# Patient Record
Sex: Female | Born: 1970 | Race: White | Hispanic: No | Marital: Single | Smoking: Never smoker
Health system: Southern US, Community
[De-identification: ages and names within clinical notes are randomized; demographics above are authoritative.]

## PROBLEM LIST (undated history)

## (undated) DIAGNOSIS — I639 Cerebral infarction, unspecified: Secondary | ICD-10-CM

## (undated) DIAGNOSIS — G459 Transient cerebral ischemic attack, unspecified: Secondary | ICD-10-CM

## (undated) DIAGNOSIS — E785 Hyperlipidemia, unspecified: Secondary | ICD-10-CM

## (undated) HISTORY — DX: Hyperlipidemia, unspecified: E78.5

## (undated) HISTORY — DX: Cerebral infarction, unspecified: I63.9

## (undated) HISTORY — DX: Transient cerebral ischemic attack, unspecified: G45.9

---

## 2014-02-23 DIAGNOSIS — I639 Cerebral infarction, unspecified: Secondary | ICD-10-CM

## 2014-02-23 DIAGNOSIS — Q211 Atrial septal defect, unspecified: Secondary | ICD-10-CM

## 2014-02-23 HISTORY — DX: Atrial septal defect, unspecified: Q21.10

## 2014-02-23 HISTORY — DX: Cerebral infarction, unspecified: I63.9

## 2015-01-22 ENCOUNTER — Observation Stay (HOSPITAL_COMMUNITY): Payer: BLUE CROSS/BLUE SHIELD

## 2015-01-22 ENCOUNTER — Encounter (HOSPITAL_COMMUNITY): Payer: Self-pay | Admitting: Emergency Medicine

## 2015-01-22 ENCOUNTER — Emergency Department (HOSPITAL_COMMUNITY): Payer: BLUE CROSS/BLUE SHIELD

## 2015-01-22 ENCOUNTER — Inpatient Hospital Stay (HOSPITAL_COMMUNITY)
Admission: EM | Admit: 2015-01-22 | Discharge: 2015-01-25 | DRG: 065 | Disposition: A | Discharge status: Home / Self Care | Payer: BLUE CROSS/BLUE SHIELD | Attending: Internal Medicine | Admitting: Internal Medicine

## 2015-01-22 DIAGNOSIS — I6340 Cerebral infarction due to embolism of unspecified cerebral artery: Secondary | ICD-10-CM | POA: Diagnosis not present

## 2015-01-22 DIAGNOSIS — D649 Anemia, unspecified: Secondary | ICD-10-CM | POA: Diagnosis present

## 2015-01-22 DIAGNOSIS — I253 Aneurysm of heart: Secondary | ICD-10-CM | POA: Diagnosis present

## 2015-01-22 DIAGNOSIS — E785 Hyperlipidemia, unspecified: Secondary | ICD-10-CM

## 2015-01-22 DIAGNOSIS — I10 Essential (primary) hypertension: Secondary | ICD-10-CM | POA: Diagnosis present

## 2015-01-22 DIAGNOSIS — G458 Other transient cerebral ischemic attacks and related syndromes: Secondary | ICD-10-CM

## 2015-01-22 DIAGNOSIS — Q211 Atrial septal defect, unspecified: Secondary | ICD-10-CM

## 2015-01-22 DIAGNOSIS — G459 Transient cerebral ischemic attack, unspecified: Secondary | ICD-10-CM

## 2015-01-22 DIAGNOSIS — I6789 Other cerebrovascular disease: Secondary | ICD-10-CM

## 2015-01-22 DIAGNOSIS — I639 Cerebral infarction, unspecified: Secondary | ICD-10-CM | POA: Diagnosis present

## 2015-01-22 HISTORY — DX: Transient cerebral ischemic attack, unspecified: G45.9

## 2015-01-22 HISTORY — DX: Hyperlipidemia, unspecified: E78.5

## 2015-01-22 LAB — I-STAT CHEM 8, ED
BUN: 13 mg/dL (ref 6–20)
CALCIUM ION: 1.05 mmol/L — AB (ref 1.12–1.23)
CHLORIDE: 104 mmol/L (ref 101–111)
CREATININE: 0.8 mg/dL (ref 0.44–1.00)
Glucose, Bld: 93 mg/dL (ref 65–99)
HCT: 40 % (ref 36.0–46.0)
Hemoglobin: 13.6 g/dL (ref 12.0–15.0)
Potassium: 3.6 mmol/L (ref 3.5–5.1)
Sodium: 136 mmol/L (ref 135–145)
TCO2: 19 mmol/L (ref 0–100)

## 2015-01-22 LAB — COMPREHENSIVE METABOLIC PANEL
ALBUMIN: 4 g/dL (ref 3.5–5.0)
ALK PHOS: 77 U/L (ref 38–126)
ALT: 18 U/L (ref 14–54)
ANION GAP: 9 (ref 5–15)
AST: 22 U/L (ref 15–41)
BILIRUBIN TOTAL: 0.8 mg/dL (ref 0.3–1.2)
BUN: 11 mg/dL (ref 6–20)
CALCIUM: 9 mg/dL (ref 8.9–10.3)
CO2: 21 mmol/L — AB (ref 22–32)
CREATININE: 0.93 mg/dL (ref 0.44–1.00)
Chloride: 106 mmol/L (ref 101–111)
GFR calc Af Amer: >60 mL/min (ref >60)
GFR calc non Af Amer: >60 mL/min (ref >60)
GLUCOSE: 100 mg/dL — AB (ref 65–99)
Potassium: 3.6 mmol/L (ref 3.5–5.1)
SODIUM: 136 mmol/L (ref 135–145)
TOTAL PROTEIN: 7.1 g/dL (ref 6.5–8.1)

## 2015-01-22 LAB — DIFFERENTIAL
Basophils Absolute: 0 10*3/uL (ref 0.0–0.1)
Basophils Relative: 0 %
EOS PCT: 1 %
Eosinophils Absolute: 0.1 10*3/uL (ref 0.0–0.7)
LYMPHS ABS: 2 10*3/uL (ref 0.7–4.0)
LYMPHS PCT: 31 %
Monocytes Absolute: 0.4 10*3/uL (ref 0.1–1.0)
Monocytes Relative: 6 %
NEUTROS ABS: 3.9 10*3/uL (ref 1.7–7.7)
NEUTROS PCT: 62 %

## 2015-01-22 LAB — PROTIME-INR
INR: 1.07 (ref 0.00–1.49)
Prothrombin Time: 14.1 seconds (ref 11.6–15.2)

## 2015-01-22 LAB — APTT: aPTT: 28 seconds (ref 24–37)

## 2015-01-22 LAB — CBC
HCT: 38.5 % (ref 36.0–46.0)
HEMOGLOBIN: 12.7 g/dL (ref 12.0–15.0)
MCH: 29.5 pg (ref 26.0–34.0)
MCHC: 33 g/dL (ref 30.0–36.0)
MCV: 89.3 fL (ref 78.0–100.0)
PLATELETS: 192 10*3/uL (ref 150–400)
RBC: 4.31 MIL/uL (ref 3.87–5.11)
RDW: 13.4 % (ref 11.5–15.5)
WBC: 6.3 10*3/uL (ref 4.0–10.5)

## 2015-01-22 LAB — ETHANOL: Alcohol, Ethyl (B): <5 mg/dL (ref <5)

## 2015-01-22 LAB — I-STAT TROPONIN, ED: Troponin i, poc: 0 ng/mL (ref 0.00–0.08)

## 2015-01-22 LAB — CBG MONITORING, ED: Glucose-Capillary: 93 mg/dL (ref 65–99)

## 2015-01-22 MED ORDER — ASPIRIN 325 MG PO TABS
325.0000 mg | ORAL_TABLET | Freq: Every day | ORAL | Status: DC
  Administered 2015-01-22 – 2015-01-25 (×4): 325 mg via ORAL
  Filled 2015-01-22 (×4): qty 1

## 2015-01-22 MED ORDER — SENNOSIDES-DOCUSATE SODIUM 8.6-50 MG PO TABS: 1.0000 | ORAL_TABLET | Freq: Every evening | ORAL | Status: DC | PRN

## 2015-01-22 MED ORDER — IOHEXOL 350 MG/ML SOLN
50.0000 mL | Freq: Once | INTRAVENOUS | Status: AC | PRN
  Administered 2015-01-22: 50 mL via INTRAVENOUS

## 2015-01-22 MED ORDER — ASPIRIN 300 MG RE SUPP: 300.0000 mg | Freq: Every day | RECTAL | Status: DC

## 2015-01-22 MED ORDER — STROKE: EARLY STAGES OF RECOVERY BOOK
Freq: Once | Status: AC
Start: 2015-01-22 — End: 2015-01-22
  Administered 2015-01-22: 16:00:00

## 2015-01-22 MED ORDER — SODIUM CHLORIDE 0.9 % IV SOLN
INTRAVENOUS | Status: DC
  Administered 2015-01-22: 19:00:00 via INTRAVENOUS

## 2015-01-22 MED ORDER — ENOXAPARIN SODIUM 30 MG/0.3ML ~~LOC~~ SOLN
30.0000 mg | SUBCUTANEOUS | Status: DC
  Administered 2015-01-22: 30 mg via SUBCUTANEOUS
  Filled 2015-01-22: qty 0.3

## 2015-01-22 NOTE — ED Provider Notes (Signed)
CSN: [id number]     Arrival date & time 01/22/15  1129 History   First MD Initiated Contact with Patient 01/22/15 1153     Chief Complaint  Patient presents with  . Dizziness  . Blurred Vision    @EDPCLEARED @ (Consider location/radiation/quality/duration/timing/severity/associated sxs/prior Treatment) HPI  She is a 44 year old female with no history of hypertension or hyperlipidemia presenting with blurred vision in her right eye and right leg weakness. This happened 30 minutes prior to arrival. On arrival this was mildly resolving. Code stroke was called. Patient went to CAT scan . Neurology saw patient. Patient's symptoms are now completely resolved. She has no headache. She is no history of this. She's both [place] and [place] speaking.   History reviewed. No pertinent past medical history. History reviewed. No pertinent past surgical history. No family history on file. Social History  Substance Use Topics  . Smoking status: None  . Smokeless tobacco: None  . Alcohol Use: None   OB History    No data available     Review of Systems  Constitutional: Negative for activity change and fatigue.  HENT: Negative for congestion.   Eyes: Negative for discharge.  Respiratory: Negative for cough and chest tightness.   Cardiovascular: Negative for chest pain.  Gastrointestinal: Negative for abdominal distention.  Genitourinary: Negative for dysuria and difficulty urinating.  Musculoskeletal: Negative for joint swelling.  Skin: Negative for rash.  Allergic/Immunologic: Negative for immunocompromised state.  Neurological: Positive for weakness. Negative for seizures and speech difficulty.  Psychiatric/Behavioral: Negative for agitation.      Allergies  Review of patient's allergies indicates not on file.  Home Medications   Prior to Admission medications   Not on File   BP 120/102 mmHg  Pulse 85  Resp 24  SpO2 97%  LMP 01/22/2015 (Exact Date) Physical Exam   Constitutional: She is oriented to person, place, and time. She appears well-developed and well-nourished.  HENT:  Head: Normocephalic and atraumatic.  Eyes: Conjunctivae are normal. Right eye exhibits no discharge.  Neck: Neck supple.  Cardiovascular: Normal rate, regular rhythm and normal heart sounds.   No murmur heard. Pulmonary/Chest: Effort normal and breath sounds normal. She has no wheezes. She has no rales.  Abdominal: Soft. She exhibits no distension. There is no tenderness.  Musculoskeletal: Normal range of motion. She exhibits no edema.  Neurological: She is oriented to person, place, and time. No cranial nerve deficit.  Skin: Skin is warm and dry. No rash noted. She is not diaphoretic.  Psychiatric: She has a normal mood and affect. Her behavior is normal.  Nursing note and vitals reviewed.   ED Course  Procedures (including critical care time) Labs Review Labs Reviewed  COMPREHENSIVE METABOLIC PANEL - Abnormal; Notable for the following:    CO2 21 (*)    Glucose, Bld 100 (*)    All other components within normal limits  I-STAT CHEM 8, ED - Abnormal; Notable for the following:    Calcium, Ion 1.05 (*)    All other components within normal limits  ETHANOL  PROTIME-INR  APTT  CBC  DIFFERENTIAL  URINE RAPID DRUG SCREEN, HOSP PERFORMED  URINALYSIS, ROUTINE W REFLEX MICROSCOPIC (NOT AT [facility])  I-STAT TROPOININ, ED  CBG MONITORING, ED    Imaging Review Ct Head Wo Contrast  01/22/2015  CLINICAL DATA:  Code stroke.  Blurred vision.  Right-sided weakness. EXAM: CT HEAD WITHOUT CONTRAST TECHNIQUE: Contiguous axial images were obtained from the base of the skull through the vertex without intravenous  contrast. COMPARISON:  None. FINDINGS: Ventricle size is normal. Negative for acute or chronic infarction. Negative for hemorrhage or fluid collection. Negative for mass or edema. No shift of the midline structures. Calvarium is intact. Right upper third molar periapical  lucency compatible with dental infection. IMPRESSION: Normal CT of the brain. Critical Value/emergent results were called by telephone at the time of interpretation on 01/22/2015 at 12:03 pm to Dr. Silverio Decamp , who verbally acknowledged these results. Electronically Signed   By: Franchot Gallo M.D.   On: 01/22/2015 12:04   I have personally reviewed and evaluated these images and lab results as part of my medical decision-making.   EKG Interpretation   Date/Time:  Tuesday January 22 2015 12:06:06 EST Ventricular Rate:  87 PR Interval:  170 QRS Duration: 106 QT Interval:  398 QTC Calculation: 479 R Axis:   56 Text Interpretation:  Sinus rhythm Ventricular premature complex RSR' in  V1 or V2, right VCD or RVH no acute ischemia. Confirmed by Gerald Leitz ([id number]) on 01/22/2015 12:59:09 PM      MDM   Final diagnoses:  TIA (transient ischemic attack)  TIA (transient ischemic attack)   patient is a pleasant 44 year old female with no significant past medical history presenting with strokelike symptoms of right eye blurriness and right leg weakness. Code stroke was called. Patient's symptoms now resolved. Patient is to be admitted to watch inside the TIA window.. Will admit to medicine. Patient has no somatic complaints at this time.  Kenly Xiao Julio Alm, MD 01/22/15 1259

## 2015-01-22 NOTE — ED Notes (Signed)
CODE STROKE ACTIVATED @ 11:35

## 2015-01-22 NOTE — H&P (Signed)
[facility] History and Physical  Cicley Main [id number] DOB: March 11, 1970 DOA: 01/22/2015  Referring physician: Thomasene Lot  PCP: No primary care provider on file.   Chief Complaint: Blurry vision right eye.   HPI: Tiffany Gardner is a 44 y.o. female with PMH significant for HTN, HLD who presents with  Right eye blurry vision, and right leg weakness, patient symptoms resolved, didn't required TPA. Patient relates feeling better, weakness has resolved, numbness resolved. She relates mild headaches. Denies chest pain, or dyspnea. She doesn't have PM problems.   Evaluation in the ED;   Labs unremarkable, CT head normal.   Review of Systems:  Negative, except as per HPI   PMH; none per patient.   History reviewed. No pertinent past surgical history. Social History:  has no tobacco, alcohol, and drug history on file.  No Known Allergies  Family History: Father: throat cancer,   Prior to Admission medications   Not on File  Does not take any medications.   Physical Exam: Filed Vitals:   01/22/15 1415 01/22/15 1442 01/22/15 1445 01/22/15 1600  BP: 125/71 138/61  105/52  Pulse: 84 77  82  Temp:  98.7 F (37.1 C)  98.8 F (37.1 C)  TempSrc:  Oral  Oral  Resp: 16 18  20   Height:   5\' 9"  (1.753 m)   Weight:   87.998 kg (194 lb)   SpO2: 94% 98%  97%    Wt Readings from Last 3 Encounters:  01/22/15 87.998 kg (194 lb)    General:  Appears calm and comfortable Eyes: PERRL, normal lids, irises & conjunctiva ENT: grossly normal hearing, lips & tongue Neck: no LAD, masses or thyromegaly Cardiovascular: RRR, no m/r/g. No LE edema. Telemetry: SR, no arrhythmias  Respiratory: CTA bilaterally, no w/r/r. Normal respiratory effort. Abdomen: soft, ntnd Skin: no rash or induration seen on limited exam Musculoskeletal: grossly normal tone BUE/BLE Psychiatric: grossly normal mood and affect, speech fluent and appropriate Neurologic: grossly non-focal.          Labs on Admission:    Basic Metabolic Panel:  Recent Labs Lab 01/22/15 1155 01/22/15 1203  NA 136 136  K 3.6 3.6  CL 106 104  CO2 21*  --   GLUCOSE 100* 93  BUN 11 13  CREATININE 0.93 0.80  CALCIUM 9.0  --    Liver Function Tests:  Recent Labs Lab 01/22/15 1155  AST 22  ALT 18  ALKPHOS 77  BILITOT 0.8  PROT 7.1  ALBUMIN 4.0   No results for input(s): LIPASE, AMYLASE in the last 168 hours. No results for input(s): AMMONIA in the last 168 hours. CBC:  Recent Labs Lab 01/22/15 1155 01/22/15 1203  WBC 6.3  --   NEUTROABS 3.9  --   HGB 12.7 13.6  HCT 38.5 40.0  MCV 89.3  --   PLT 192  --    Cardiac Enzymes: No results for input(s): CKTOTAL, CKMB, CKMBINDEX, TROPONINI in the last 168 hours.  BNP (last 3 results) No results for input(s): BNP in the last 8760 hours.  ProBNP (last 3 results) No results for input(s): PROBNP in the last 8760 hours.  CBG:  Recent Labs Lab 01/22/15 1208  GLUCAP 93    Radiological Exams on Admission: Ct Angio Head W/cm &/or Wo Cm  01/22/2015  CLINICAL DATA:  Right-sided weakness and dizziness. Code stroke earlier today. Symptoms have now resolved. EXAM: CT ANGIOGRAPHY HEAD AND NECK TECHNIQUE: Multidetector CT imaging of the head and neck was performed  using the standard protocol during bolus administration of intravenous contrast. Multiplanar CT image reconstructions and MIPs were obtained to evaluate the vascular anatomy. Carotid stenosis measurements (when applicable) are obtained utilizing NASCET criteria, using the distal internal carotid diameter as the denominator. CONTRAST:  38mL OMNIPAQUE IOHEXOL 350 MG/ML SOLN COMPARISON:  Noncontrast head CT earlier today FINDINGS: CT HEAD Brain: There is no evidence of acute infarct, intracranial hemorrhage, mass, midline shift, or extra-axial fluid collection on this contrast-enhanced CT. Ventricles and sulci are normal. Calvarium and skull base: No fracture or destructive osseous lesion. Paranasal sinuses:  Clear. Minimal right maxillary sinus mucosal thickening and osteitis is suggestive of chronic sinusitis. Right maxillary molar periapical lucency as described on CT earlier today. Orbits: Unremarkable. CTA NECK Aortic arch: 3 vessel aortic arch. Brachiocephalic and subclavian arteries are widely patent. Right carotid system: Patent without evidence of stenosis, dissection, or aneurysm. No significant atherosclerosis. Superior thyroid artery incidentally arises at the carotid bifurcation. Left carotid system: Patent without evidence of stenosis, dissection, or aneurysm. No significant atherosclerotic disease. Vertebral arteries: Widely patent and codominant. Skeleton: Mild lower cervical disc degeneration, greatest at C6-7. Other neck: Heterogeneous, asymmetric enlargement of the right thyroid lobe with discrete nodules measuring up to 11 mm in size. CTA HEAD Anterior circulation: Internal carotid arteries are patent from skullbase to carotid termini without stenosis. ACAs and MCAs are patent without evidence of sizable branch vessel occlusion or significant stenosis. No intracranial aneurysm is identified. Posterior circulation: Intracranial vertebral arteries are widely patent to the basilar. PICA and SCA origins are patent. Basilar artery is patent without stenosis. Posterior communicating arteries are not clearly identified. PCAs are patent without evidence of significant stenosis. Venous sinuses: Patent. Anatomic variants: None. Delayed phase: No abnormal enhancement. IMPRESSION: 1. No evidence of medium or large vessel intracranial arterial occlusion or significant stenosis. 2. Widely patent cervical carotid and vertebral arteries. Electronically Signed   By: Logan Bores M.D.   On: 01/22/2015 14:20   Dg Chest 2 View  01/22/2015  CLINICAL DATA:  Chest pain. EXAM: CHEST  2 VIEW COMPARISON:  None. FINDINGS: The heart size and mediastinal contours are within normal limits. Both lungs are clear. No pneumothorax  or pleural effusion is noted. The visualized skeletal structures are unremarkable. IMPRESSION: No active cardiopulmonary disease. Electronically Signed   By: Marijo Conception, M.D.   On: 01/22/2015 15:56   Ct Head Wo Contrast  01/22/2015  CLINICAL DATA:  Code stroke.  Blurred vision.  Right-sided weakness. EXAM: CT HEAD WITHOUT CONTRAST TECHNIQUE: Contiguous axial images were obtained from the base of the skull through the vertex without intravenous contrast. COMPARISON:  None. FINDINGS: Ventricle size is normal. Negative for acute or chronic infarction. Negative for hemorrhage or fluid collection. Negative for mass or edema. No shift of the midline structures. Calvarium is intact. Right upper third molar periapical lucency compatible with dental infection. IMPRESSION: Normal CT of the brain. Critical Value/emergent results were called by telephone at the time of interpretation on 01/22/2015 at 12:03 pm to Dr. Silverio Decamp , who verbally acknowledged these results. Electronically Signed   By: Franchot Gallo M.D.   On: 01/22/2015 12:04   Ct Angio Neck W/cm &/or Wo/cm  01/22/2015  CLINICAL DATA:  Right-sided weakness and dizziness. Code stroke earlier today. Symptoms have now resolved. EXAM: CT ANGIOGRAPHY HEAD AND NECK TECHNIQUE: Multidetector CT imaging of the head and neck was performed using the standard protocol during bolus administration of intravenous contrast. Multiplanar CT image reconstructions and MIPs were  obtained to evaluate the vascular anatomy. Carotid stenosis measurements (when applicable) are obtained utilizing NASCET criteria, using the distal internal carotid diameter as the denominator. CONTRAST:  51mL OMNIPAQUE IOHEXOL 350 MG/ML SOLN COMPARISON:  Noncontrast head CT earlier today FINDINGS: CT HEAD Brain: There is no evidence of acute infarct, intracranial hemorrhage, mass, midline shift, or extra-axial fluid collection on this contrast-enhanced CT. Ventricles and sulci are normal. Calvarium  and skull base: No fracture or destructive osseous lesion. Paranasal sinuses: Clear. Minimal right maxillary sinus mucosal thickening and osteitis is suggestive of chronic sinusitis. Right maxillary molar periapical lucency as described on CT earlier today. Orbits: Unremarkable. CTA NECK Aortic arch: 3 vessel aortic arch. Brachiocephalic and subclavian arteries are widely patent. Right carotid system: Patent without evidence of stenosis, dissection, or aneurysm. No significant atherosclerosis. Superior thyroid artery incidentally arises at the carotid bifurcation. Left carotid system: Patent without evidence of stenosis, dissection, or aneurysm. No significant atherosclerotic disease. Vertebral arteries: Widely patent and codominant. Skeleton: Mild lower cervical disc degeneration, greatest at C6-7. Other neck: Heterogeneous, asymmetric enlargement of the right thyroid lobe with discrete nodules measuring up to 11 mm in size. CTA HEAD Anterior circulation: Internal carotid arteries are patent from skullbase to carotid termini without stenosis. ACAs and MCAs are patent without evidence of sizable branch vessel occlusion or significant stenosis. No intracranial aneurysm is identified. Posterior circulation: Intracranial vertebral arteries are widely patent to the basilar. PICA and SCA origins are patent. Basilar artery is patent without stenosis. Posterior communicating arteries are not clearly identified. PCAs are patent without evidence of significant stenosis. Venous sinuses: Patent. Anatomic variants: None. Delayed phase: No abnormal enhancement. IMPRESSION: 1. No evidence of medium or large vessel intracranial arterial occlusion or significant stenosis. 2. Widely patent cervical carotid and vertebral arteries. Electronically Signed   By: Logan Bores M.D.   On: 01/22/2015 14:20   Mr Brain Wo Contrast  01/22/2015  ADDENDUM REPORT: 01/22/2015 17:48 ADDENDUM: Study discussed by telephone with Dr. Niel Hummer  on 01/22/2015 at 1735 hours. Electronically Signed   By: Genevie Ann M.D.   On: 01/22/2015 17:48  01/22/2015  CLINICAL DATA:  44 year old female with acute onset right visual field changes at 1050 hours today, associated with dizziness. Initial encounter. EXAM: MRI HEAD WITHOUT CONTRAST MRA HEAD WITHOUT CONTRAST TECHNIQUE: Multiplanar, multiecho pulse sequences of the brain and surrounding structures were obtained without intravenous contrast. Angiographic images of the head were obtained using MRA technique without contrast. COMPARISON:  CTA head and neck 1342 hours today. FINDINGS: MRI HEAD FINDINGS There are 2 small foci of restricted diffusion in the left posterior circulation. The larger is in the inferior left occipital lobe seen on series 3, image 31. There is then a subtle linear area of restricted diffusion in the left superior cerebellum (image 34). Both of these are visible on coronal diffusion imaging also. There is no associated parenchymal T2 or FLAIR hyperintensity. No associated hemorrhage or mass effect. There is subtle increased FLAIR signal in the left PCA branch visible on series 8, image 11. Major intracranial vascular flow voids are within normal limits. No other restricted diffusion. Pear[place] Cables and white matter signal elsewhere is within normal limits. No chronic cerebral blood products. No midline shift, mass effect, evidence of mass lesion, ventriculomegaly, extra-axial collection or acute intracranial hemorrhage. Cervicomedullary junction and pituitary are within normal limits. Negative visualized cervical spine. Normal bone marrow signal. Visible internal auditory structures appear normal. Mastoids are clear. Mild paranasal sinus mucosal thickening. Optic chiasm and bilateral  intraorbital soft tissues are normal. Negative scalp soft tissues. MRA HEAD FINDINGS Antegrade flow in the posterior circulation with codominant distal vertebral arteries. No distal vertebral artery stenosis. Bilateral PICA  flow is present. Normal vertebrobasilar junction. No basilar stenosis. SCA and PCA origins are normal. Posterior communicating arteries are diminutive or absent. Bilateral PCA branches appear normal, including those on the left where subtle increased FLAIR signal was noted. Antegrade flow in both ICA siphons. No siphon stenosis. Normal ophthalmic artery origins. Normal carotid termini, MCA and ACA origins. Diminutive anterior communicating artery. Visualized bilateral ACA branches, and visualized bilateral MCA branches are within normal limits. IMPRESSION: 1. 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 No associated hemorrhage or mass effect. 2.  Negative intracranial MRA. 3. Otherwise normal noncontrast MRI appearance of the brain. Electronically Signed: By: Genevie Ann M.D. On: 01/22/2015 17:27   Mr Jodene Nam Head/brain Wo Cm  01/22/2015  ADDENDUM REPORT: 01/22/2015 17:48 ADDENDUM: Study discussed by telephone with Dr. Niel Hummer on 01/22/2015 at 1735 hours. Electronically Signed   By: Genevie Ann M.D.   On: 01/22/2015 17:48  01/22/2015  CLINICAL DATA:  44 year old female with acute onset right visual field changes at 1050 hours today, associated with dizziness. Initial encounter. EXAM: MRI HEAD WITHOUT CONTRAST MRA HEAD WITHOUT CONTRAST TECHNIQUE: Multiplanar, multiecho pulse sequences of the brain and surrounding structures were obtained without intravenous contrast. Angiographic images of the head were obtained using MRA technique without contrast. COMPARISON:  CTA head and neck 1342 hours today. FINDINGS: MRI HEAD FINDINGS There are 2 small foci of restricted diffusion in the left posterior circulation. The larger is in the inferior left occipital lobe seen on series 3, image 31. There is then a subtle linear area of restricted diffusion in the left superior cerebellum (image 34). Both of these are visible on coronal diffusion imaging also. There is no  associated parenchymal T2 or FLAIR hyperintensity. No associated hemorrhage or mass effect. There is subtle increased FLAIR signal in the left PCA branch visible on series 8, image 11. Major intracranial vascular flow voids are within normal limits. No other restricted diffusion. Pear[place] Cables and white matter signal elsewhere is within normal limits. No chronic cerebral blood products. No midline shift, mass effect, evidence of mass lesion, ventriculomegaly, extra-axial collection or acute intracranial hemorrhage. Cervicomedullary junction and pituitary are within normal limits. Negative visualized cervical spine. Normal bone marrow signal. Visible internal auditory structures appear normal. Mastoids are clear. Mild paranasal sinus mucosal thickening. Optic chiasm and bilateral intraorbital soft tissues are normal. Negative scalp soft tissues. MRA HEAD FINDINGS Antegrade flow in the posterior circulation with codominant distal vertebral arteries. No distal vertebral artery stenosis. Bilateral PICA flow is present. Normal vertebrobasilar junction. No basilar stenosis. SCA and PCA origins are normal. Posterior communicating arteries are diminutive or absent. Bilateral PCA branches appear normal, including those on the left where subtle increased FLAIR signal was noted. Antegrade flow in both ICA siphons. No siphon stenosis. Normal ophthalmic artery origins. Normal carotid termini, MCA and ACA origins. Diminutive anterior communicating artery. Visualized bilateral ACA branches, and visualized bilateral MCA branches are within normal limits. IMPRESSION: 1. 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 No associated hemorrhage or mass effect. 2.  Negative intracranial MRA. 3. Otherwise normal noncontrast MRI appearance of the brain. Electronically Signed: By: Genevie Ann M.D. On: 01/22/2015 17:27    EKG: Independently reviewed. Sinus PVC.   Assessment/Plan Active Problems:  TIA (transient ischemic attack)   HTN (hypertension)   Hyperlipidemia  1-TIA, Vs Stroke. Patient presents with numbness left side weakness. Her symptoms has resolved. She didn't required TPA, due to resolving symptoms.  Permissive HTN.  Check MRI, MRA which came back positive for stroke. Neurology aware of results.  ECHO, Carotids doppler, HbA1c, lipid panel.  IV fluids. Aspirin.   Code Status: Full code.  DVT Prophylaxis:  Family Communication:CAre discussed with patient. Disposition Plan: expect 2 days inpatient.   Time spent: 75 minutes.   Niel Hummer A [facility] Pager [phone]

## 2015-01-22 NOTE — ED Notes (Addendum)
Patient reports sudden onset of right sided blurred vision, dizziness, and right lower extremity heaviness at 11a. On exam in Triage at 1132a patient states that she was able to walk to car but felt like right leg was weak, no weakness noted to right leg or any extremities. No slurred speech noted. Sensation intact to all extremities. Patient denies headache. Case reviewed with EDP at Code Stroke initiated due to sudden onset of symptoms.

## 2015-01-22 NOTE — Consult Note (Signed)
Requesting Physician: Dr. Thomasene Lot    Chief Complaint: Code stroke  HPI:                                                                                                                                         Tiffany Gardner is an 44 y.o. female who was at baseline while at work today.  At 10:50 she had dropped her key in her right hand and attempted to reach down to get them. She could not see the keys in her right visual field. She then stood in one place as she felt her right side did not feel right and she was dizzy. Coworkers noted something was off and drove her to the ED. On time of arrival her symptoms resolved. She has no PMHx and takes no medications.   Date last known well: Date: 01/22/2015 Time last known well: Time: 10:50 tPA Given: No: symptoms resolved Modified Rankin: Rankin Score=0    History reviewed. No pertinent past medical history.  History reviewed. No pertinent past surgical history.  No family history on file. Social History:  has no tobacco, alcohol, and drug history on file.  Allergies: Allergies not on file  Medications:                                                                                                                           None  ROS:                                                                                                                                       History obtained from the patient  General ROS: negative for - chills, fatigue, fever, night sweats, weight gain or weight loss Psychological ROS: negative for - behavioral disorder, hallucinations, memory difficulties, mood swings  or suicidal ideation Ophthalmic ROS: negative for - blurry vision, double vision, eye pain or loss of vision ENT ROS: negative for - epistaxis, nasal discharge, oral lesions, sore throat, tinnitus or vertigo Allergy and Immunology ROS: negative for - hives or itchy/watery eyes Hematological and Lymphatic ROS: negative for - bleeding problems, bruising or  swollen lymph nodes Endocrine ROS: negative for - galactorrhea, hair pattern changes, polydipsia/polyuria or temperature intolerance Respiratory ROS: negative for - cough, hemoptysis, shortness of breath or wheezing Cardiovascular ROS: negative for - chest pain, dyspnea on exertion, edema or irregular heartbeat Gastrointestinal ROS: negative for - abdominal pain, diarrhea, hematemesis, nausea/vomiting or stool incontinence Genito-Urinary ROS: negative for - dysuria, hematuria, incontinence or urinary frequency/urgency Musculoskeletal ROS: negative for - joint swelling or muscular weakness Neurological ROS: as noted in HPI Dermatological ROS: negative for rash and skin lesion changes  Neurologic Examination:                                                                                                      Last menstrual period 01/22/2015.  HEENT-  Normocephalic, no lesions, without obvious abnormality.  Normal external eye and conjunctiva.  Normal TM's bilaterally.  Normal auditory canals and external ears. Normal external nose, mucus membranes and septum.  Normal pharynx. Cardiovascular- S1, S2 normal, pulses palpable throughout   Lungs- chest clear, no wheezing, rales, normal symmetric air entry Abdomen- normal findings: bowel sounds normal Extremities- no edema Lymph-no adenopathy palpable Musculoskeletal-no joint tenderness, deformity or swelling Skin-warm and dry, no hyperpigmentation, vitiligo, or suspicious lesions  Neurological Examination Mental Status: Alert, oriented, thought content appropriate.  Speech fluent without evidence of aphasia.  Able to follow 3 step commands without difficulty. Cranial Nerves: II:   Visual fields grossly normal, pupils equal, round, reactive to light and accommodation III,IV, VI: ptosis not present, extra-ocular motions intact bilaterally V,VII: smile symmetric, facial light touch sensation normal bilaterally VIII: hearing normal  bilaterally IX,X: uvula rises symmetrically XI: bilateral shoulder shrug XII: midline tongue extension Motor: Right : Upper extremity   5/5    Left:     Upper extremity   5/5  Lower extremity   5/5     Lower extremity   5/5 Tone and bulk:normal tone throughout; no atrophy noted Sensory: Pinprick and light touch intact throughout, bilaterally Deep Tendon Reflexes: 2+ and symmetric throughout Plantars: Right: downgoing   Left: downgoing Cerebellar: normal finger-to-nose, normal rapid alternating movements and normal heel-to-shin test Gait: Deferred   Lab Results:  EKG normal sinus rhythm.   Basic Metabolic Panel, CBC: Reviewed, unremarkable  Imaging: CT of the brain done in the ER was unremarkable.   Assessment: 44 y.o. female with transient symptoms , suggestive of possible right sided weakness and right hemianopsia.  Symptoms completely resolved within one hour. At the time of evaluation in the emergency room, she had a nonfocal neurological examination . Based on this, she is not considered a candidate for acute IV TPA.  Given the reliable history of the symptoms , I would consider a transient ischemic event in the differential .  Recommend  further neurodiagnostic workup with a CT angiogram of the head and neck followed by brain MRI study.  At the time of finalizing this note, CTA head and neck and MRI studies were completed.  CT angiogram of the head and neck were unremarkable. Later in the afternoon, a brain MRI study was performed, which on my review showed a tiny acute restricted diffusion in the left posterior temporal lobe which appears to be involving the distal left PCA vascular territory. On the FLAIR, and the gradient echo images an occluded tiny distal branch vessel is noted adjacent to the area of restricted diffusion. Based on the MRI appearance this is most likely cardioembolic in etiology. Another punctate restricted diffusion is seen in the left cerebellum.   Stroke  Risk Factors - none known at this time  Recommend: 1. HgbA1c, fasting lipid panel, lupus / antiphospholipid antibodies. consider further hyper-coag workup in future if lupus and antiphospholipid antibodies are negative.  2. Transthoracic Echocardiogram with a bubble study 3. Aspirin - dose 325 mg daily 4. Cardiac Telemetry monitoring to rule out paroxysmal atrial fibrillation 5. Frequent neuro checks per unit protocol, NPO until passes stroke swallow screen.   (please call stroke NP [phone] starting 11.30.2016)

## 2015-01-22 NOTE — Code Documentation (Signed)
44yo female arriving to [facility] via private vehicle at 1129.  Patient was at work at 1110 when she developed acute dizziness, blurred vision and right sided "heaviness".  Coworkers noticed patient was dropping things and needed to sit down.  Patient did not improve and was brought to the hospital.  Code stroke activated.  Patient to CT.  Stroke team to the bedside.  NIHSS 0, see documentation for details and code stroke times.  Patient with difficulty naming objects on NIHSS cards, but able to read and describe picture.  Also able to name physical objects presented to her.  Patient repeating that she is "nervous".  No acute stroke treatment at this time per Dr. Silverio Decamp.  TIA alert.  Bedside handoff with ED RN Catalina Antigua.

## 2015-01-22 NOTE — ED Notes (Signed)
Phlebotomy aware of code stroke, waiting for patient to finish CT exam for lab draw

## 2015-01-22 NOTE — ED Notes (Signed)
Patient complains of sudden onset of dizziness, blurred vision, and right sided "heaviness" that started at 11:10 am 01/22/2015

## 2015-01-22 NOTE — ED Notes (Addendum)
Pt to CT with RN

## 2015-01-22 NOTE — ED Notes (Signed)
Pt went to void and forgot to give specimen.

## 2015-01-23 DIAGNOSIS — Q211 Atrial septal defect: Secondary | ICD-10-CM | POA: Diagnosis not present

## 2015-01-23 DIAGNOSIS — I639 Cerebral infarction, unspecified: Secondary | ICD-10-CM | POA: Diagnosis present

## 2015-01-23 DIAGNOSIS — I10 Essential (primary) hypertension: Secondary | ICD-10-CM

## 2015-01-23 DIAGNOSIS — I6310 Cerebral infarction due to embolism of unspecified precerebral artery: Secondary | ICD-10-CM | POA: Diagnosis not present

## 2015-01-23 DIAGNOSIS — D649 Anemia, unspecified: Secondary | ICD-10-CM | POA: Diagnosis present

## 2015-01-23 DIAGNOSIS — E785 Hyperlipidemia, unspecified: Secondary | ICD-10-CM | POA: Diagnosis present

## 2015-01-23 DIAGNOSIS — G459 Transient cerebral ischemic attack, unspecified: Secondary | ICD-10-CM | POA: Diagnosis present

## 2015-01-23 DIAGNOSIS — I253 Aneurysm of heart: Secondary | ICD-10-CM | POA: Diagnosis present

## 2015-01-23 DIAGNOSIS — I6340 Cerebral infarction due to embolism of unspecified cerebral artery: Secondary | ICD-10-CM | POA: Diagnosis present

## 2015-01-23 HISTORY — DX: Cerebral infarction, unspecified: I63.9

## 2015-01-23 LAB — URINALYSIS, ROUTINE W REFLEX MICROSCOPIC
Bilirubin Urine: NEGATIVE
Glucose, UA: NEGATIVE mg/dL
Ketones, ur: NEGATIVE mg/dL
LEUKOCYTES UA: NEGATIVE
NITRITE: NEGATIVE
PROTEIN: NEGATIVE mg/dL
SPECIFIC GRAVITY, URINE: 1.021 (ref 1.005–1.030)
pH: 7 (ref 5.0–8.0)

## 2015-01-23 LAB — RAPID URINE DRUG SCREEN, HOSP PERFORMED
Amphetamines: NOT DETECTED
BARBITURATES: NOT DETECTED
Benzodiazepines: NOT DETECTED
Cocaine: NOT DETECTED
Opiates: NOT DETECTED
Tetrahydrocannabinol: NOT DETECTED

## 2015-01-23 LAB — BASIC METABOLIC PANEL
Anion gap: 5 (ref 5–15)
BUN: 8 mg/dL (ref 6–20)
CHLORIDE: 111 mmol/L (ref 101–111)
CO2: 24 mmol/L (ref 22–32)
CREATININE: 0.82 mg/dL (ref 0.44–1.00)
Calcium: 8.7 mg/dL — ABNORMAL LOW (ref 8.9–10.3)
GFR calc Af Amer: >60 mL/min (ref >60)
GFR calc non Af Amer: >60 mL/min (ref >60)
GLUCOSE: 99 mg/dL (ref 65–99)
Potassium: 3.9 mmol/L (ref 3.5–5.1)
SODIUM: 140 mmol/L (ref 135–145)

## 2015-01-23 LAB — LIPID PANEL
CHOLESTEROL: 218 mg/dL — AB (ref 0–200)
HDL: 42 mg/dL (ref >40)
LDL CALC: 153 mg/dL — AB (ref 0–99)
Total CHOL/HDL Ratio: 5.2 RATIO
Triglycerides: 113 mg/dL (ref <150)
VLDL: 23 mg/dL (ref 0–40)

## 2015-01-23 LAB — CBC
HCT: 37 % (ref 36.0–46.0)
HEMOGLOBIN: 11.9 g/dL — AB (ref 12.0–15.0)
MCH: 28.7 pg (ref 26.0–34.0)
MCHC: 32.2 g/dL (ref 30.0–36.0)
MCV: 89.4 fL (ref 78.0–100.0)
Platelets: 190 10*3/uL (ref 150–400)
RBC: 4.14 MIL/uL (ref 3.87–5.11)
RDW: 13.5 % (ref 11.5–15.5)
WBC: 5.9 10*3/uL (ref 4.0–10.5)

## 2015-01-23 LAB — URINE MICROSCOPIC-ADD ON: WBC UA: NONE SEEN WBC/hpf (ref 0–5)

## 2015-01-23 MED ORDER — ENOXAPARIN SODIUM 40 MG/0.4ML ~~LOC~~ SOLN
40.0000 mg | SUBCUTANEOUS | Status: DC
  Administered 2015-01-23 – 2015-01-24 (×2): 40 mg via SUBCUTANEOUS
  Filled 2015-01-23 (×3): qty 0.4

## 2015-01-23 MED ORDER — ATORVASTATIN CALCIUM 10 MG PO TABS
20.0000 mg | ORAL_TABLET | Freq: Every day | ORAL | Status: DC
  Administered 2015-01-23 – 2015-01-24 (×2): 20 mg via ORAL
  Filled 2015-01-23 (×3): qty 2

## 2015-01-23 NOTE — Evaluation (Signed)
Physical Therapy Evaluation Patient Details Name: Tiffany Gardner MRN: [id number] DOB: 1970-07-07 Today's Date: 01/23/2015   History of Present Illness  Tiffany Gardner is a 44 y.o. female with PMH significant for HTN, HLD who presents with Right eye blurry vision, and right leg weakness, patient symptoms resolved, didn't required TPA  Clinical Impression  Patient presents at functional baseline with mobility and no sensory or coordination deficits noted.  No further skilled PT intervention needed.  Did reinforce stroke prevention education and importance of seeking medical help early should signs recur.    Follow Up Recommendations No PT follow up    Equipment Recommendations  None recommended by PT    Recommendations for Other Services       Precautions / Restrictions Precautions Precautions: None Restrictions Weight Bearing Restrictions: No      Mobility  Bed Mobility Overal bed mobility: Independent                Transfers Overall transfer level: Independent                  Ambulation/Gait Ambulation/Gait assistance: Independent Ambulation Distance (Feet): 200 Feet Assistive device: None Gait Pattern/deviations: WFL(Within Functional Limits)        Stairs Stairs: Yes Stairs assistance: Modified independent (Device/Increase time) Stair Management: One rail Left;Alternating pattern Number of Stairs: 10 General stair comments: min rail use  Wheelchair Mobility    Modified Rankin (Stroke Patients Only) Modified Rankin (Stroke Patients Only) Pre-Morbid Rankin Score: No symptoms Modified Rankin: Slight disability     Balance Overall balance assessment: Independent Sitting-balance support: No upper extremity supported;Feet supported Sitting balance-Leahy Scale: Normal     Standing balance support: No upper extremity supported;During functional activity Standing balance-Leahy Scale: Normal                   Standardized Balance  Assessment Standardized Balance Assessment : Dynamic Gait Index   Dynamic Gait Index Level Surface: Normal Change in Gait Speed: Normal Gait with Horizontal Head Turns: Normal Gait with Vertical Head Turns: Normal Gait and Pivot Turn: Normal Step Over Obstacle: Normal Step Around Obstacles: Normal Steps: Mild Impairment Total Score: 23       Pertinent Vitals/Pain Pain Assessment: No/denies pain    Home Living Family/patient expects to be discharged to:: Private residence Living Arrangements: Children (Daughter) Available Help at Discharge: Family;Available PRN/intermittently Type of Home: House Home Access: Stairs to enter Entrance Stairs-Rails: Left Entrance Stairs-Number of Steps: 3 Home Layout: Two level;Bed/bath upstairs Home Equipment: None      Prior Function Level of Independence: Independent         Comments: works as Physiological scientist   Dominant Hand: Right    Extremity/Trunk Assessment   Upper Extremity Assessment: Overall WFL for tasks assessed           Lower Extremity Assessment: Overall WFL for tasks assessed      Cervical / Trunk Assessment: Normal  Communication   Communication: No difficulties  Cognition Arousal/Alertness: Awake/alert Behavior During Therapy: WFL for tasks assessed/performed Overall Cognitive Status: Within Functional Limits for tasks assessed                      General Comments      Exercises        Assessment/Plan    PT Assessment Patent does not need any further PT services  PT Diagnosis Generalized weakness   PT Problem List    PT  Treatment Interventions     PT Goals (Current goals can be found in the Care Plan section) Acute Rehab PT Goals Patient Stated Goal: to go home PT Goal Formulation: All assessment and education complete, DC therapy    Frequency     Barriers to discharge        Co-evaluation               End of Session Equipment Utilized During  Treatment: Gait belt Activity Tolerance: Patient tolerated treatment well Patient left: in bed      Functional Assessment Tool Used: Clinical Judgement Functional Limitation: Mobility: Walking and moving around Mobility: Walking and Moving Around Current Status [id number]): 0 percent impaired, limited or restricted Mobility: Walking and Moving Around Goal Status [phone]): 0 percent impaired, limited or restricted Mobility: Walking and Moving Around Discharge Status [phone]): 0 percent impaired, limited or restricted    Time: 0900-0919 PT Time Calculation (min) (ACUTE ONLY): 19 min   Charges:   PT Evaluation $Initial PT Evaluation Tier I: 1 Procedure     PT G Codes:   PT G-Codes **NOT FOR INPATIENT CLASS** Functional Assessment Tool Used: Clinical Judgement Functional Limitation: Mobility: Walking and moving around Mobility: Walking and Moving Around Current Status [id number]): 0 percent impaired, limited or restricted Mobility: Walking and Moving Around Goal Status [id number]): 0 percent impaired, limited or restricted Mobility: Walking and Moving Around Discharge Status [id number]): 0 percent impaired, limited or restricted    [facility] 01/23/2015, 10:45 AM  Magda Kiel, PT [phone] 01/23/2015

## 2015-01-23 NOTE — Progress Notes (Signed)
    [facility] has been requested to perform a transesophageal echocardiogram on [name] for stroke.  After careful review of history and examination, the risks and benefits of transesophageal echocardiogram have been explained including risks of esophageal damage, perforation (1:10,000 risk), bleeding, pharyngeal hematoma as well as other potential complications associated with conscious sedation including aspiration, arrhythmia, respiratory failure and death. Alternatives to treatment were discussed, questions were answered. Patient is willing to proceed.   Erlene Quan, PA 01/23/2015 1:52 PM

## 2015-01-23 NOTE — Progress Notes (Signed)
STROKE TEAM PROGRESS NOTE   HISTORY Poet Hineman is an 44 y.o. female who was at baseline while at work today. At 10:50 she had dropped her key in her right hand and attempted to reach down to get them. She could not see the keys in her right visual field. She then stood in one place as she felt her right side did not feel right and she was dizzy. Coworkers noted something was off and drove her to the ED. On time of arrival her symptoms resolved. She has no PMHx and takes no medications. Modified Rankin: Rankin Score=0. She was last known well 01/22/2015 at 10:50.  Patient was not administered TPA secondary to symptoms resolved. She was admitted for further evaluation and treatment.   SUBJECTIVE (INTERVAL HISTORY) No family is at the bedside.  Overall she feels her condition is rapidly improving. She denies any prior history of strokes, TIAs, migraines, birth control pills, DVT, pulmonary embolism, strokes or heart attacks in young age and the family. 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   OBJECTIVE Temp:  [98 F (36.7 C)-98.8 F (37.1 C)] 98.4 F (36.9 C) (11/30 0918) Pulse Rate:  [69-89] 82 (11/30 0918) Cardiac Rhythm:  [-] Normal sinus rhythm (11/30 0700) Resp:  [16-25] 20 (11/30 0918) BP: (105-138)/(50-102) 116/50 mmHg (11/30 0918) SpO2:  [94 %-100 %] 100 % (11/30 0918) Weight:  [87.998 kg (194 lb)] 87.998 kg (194 lb) (11/29 1445)  CBC:  Recent Labs Lab 01/22/15 1155 01/22/15 1203 01/23/15 0257  WBC 6.3  --  5.9  NEUTROABS 3.9  --   --   HGB 12.7 13.6 11.9*  HCT 38.5 40.0 37.0  MCV 89.3  --  89.4  PLT 192  --  233    Basic Metabolic Panel:  Recent Labs Lab 01/22/15 1155 01/22/15 1203 01/23/15 0257  NA 136 136 140  K 3.6 3.6 3.9  CL 106 104 111  CO2 21*  --  24  GLUCOSE 100* 93 99  BUN $Re'11 13 8  'hhH$ CREATININE 0.93 0.80 0.82  CALCIUM 9.0  --  8.7*    Lipid Panel:    Component Value Date/Time   CHOL 218* 01/23/2015 0257   TRIG 113 01/23/2015 0257   HDL 42  01/23/2015 0257   CHOLHDL 5.2 01/23/2015 0257   VLDL 23 01/23/2015 0257   LDLCALC 153* 01/23/2015 0257   HgbA1c: No results found for: HGBA1C Urine Drug Screen:    Component Value Date/Time   LABOPIA NONE DETECTED 01/23/2015 0041   COCAINSCRNUR NONE DETECTED 01/23/2015 0041   LABBENZ NONE DETECTED 01/23/2015 0041   AMPHETMU NONE DETECTED 01/23/2015 0041   THCU NONE DETECTED 01/23/2015 0041   LABBARB NONE DETECTED 01/23/2015 0041      IMAGING  Dg Chest 2 View 01/22/2015   No active cardiopulmonary disease. :56   Ct Head Wo Contrast 01/22/2015   Normal CT of the brain.   Ct Angio Head & Neck W/cm &/or Wo/cm 01/22/2015  1. No evidence of medium or large vessel intracranial arterial occlusion or significant stenosis. 2. Widely patent cervical carotid and vertebral arteries.   Mri & Mra Brain Wo Contrast 01/22/2015   1. 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 No associated hemorrhage or mass effect. 2.  Negative intracranial MRA. 3. Otherwise normal noncontrast MRI appearance of the brain.    PHYSICAL EXAM Pleasant young Caucasian lady not in distress. . Afebrile. Head is nontraumatic. Neck is supple  without bruit.    Cardiac exam no murmur or gallop. Lungs are clear to auscultation. Distal pulses are well felt. Neurological Exam ;  Awake  Alert oriented x 3. Normal speech and language.eye movements full without nystagmus.fundi were not visualized. Vision acuity and fields appear normal. Hearing is normal. Palatal movements are normal. Face symmetric. Tongue midline. Normal strength, tone, reflexes and coordination. Normal sensation. Gait deferred. ASSESSMENT/PLAN Ms. Rashunda Passon is a 44 y.o. female with history of HTN and HLD presenting with blurry vision R eye. She did not receive IV t-PA due to resolving symptoms.   Stroke:  left occipital and Left cerebellar infarcts, embolic secondary to unknown source  MRI  L occipital  and L cerebellar infarcts  MRA  No large vessel disease  CTA Head and neck no large vessel disease  Carotid Doppler  canceled  2D Echo  pending  TEE to look for embolic source. Arranged with [place] for tomorrow.  If positive for PFO (patent foramen ovale), check bilateral lower extremity venous dopplers to rule out DVT as possible source of stroke. (I have made patient NPO after midnight tonight).  Follow up cardiolipin abx Check ESR, ANA, HIV, RPR   LDL 153  HgbA1c pending  Lovenox 30 mg sq daily for VTE prophylaxis  Diet Heart Room service appropriate?: Yes; Fluid consistency:: Thin  No antithrombotic prior to admission, now on aspirin 325 mg daily  Patient counseled to be compliant with her antithrombotic medications  Ongoing aggressive stroke risk factor management Patient interested in considering PARFAIT trial. [facility] will follow up.   Therapy recommendations:  No therapy needs  Disposition:  Return home  Hypertension  Stable  Hyperlipidemia  Home meds:  No statin  LDL 153, goal < 70  New lipitor 20 mg added  Continue statin at discharge  Other Stroke Risk Factors  Hx 2 miscarriages  Hospital day #   [place] for Pager information 01/23/2015 1:43 PM  I have personally examined this patient, reviewed notes, independently viewed imaging studies, participated in medical decision making and plan of care. I have made any additions or clarifications directly to the above note. Agree with note above. She presented with 2 tiny embolic infarcts likely cryptogenic etiology with workup is not yet completed. She remains at risk for neurological worsening, recurrent stroke and TIAs. Recommend TEE, vasculitic labs and antiphospholipid antibodies. Start aspirin. Patient was given information to review about possible participation in the Parfait trial if  interested  Antony Contras, MD Medical Director [place] Pager: [phone] 01/23/2015 1:57 PM   To contact Stroke Continuity provider, please refer to [email or web address] After hours, contact General Neurology

## 2015-01-23 NOTE — Progress Notes (Signed)
PROGRESS NOTE    Tiffany Gardner DBZ:[id number] DOB: 1971-01-30 DOA: 01/22/2015 PCP: No primary care provider on file.  HPI/Brief narrative 44 year old female patient, PMH of HTN, HLD, chronic anemia, presented to [facility] ED on 01/22/15 with transient right-sided weakness and blurred vision in right eye. Workup confirms left occipital and cerebellar infarcts. Stroke service consulting. Workup ongoing.   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 - Etiology: Felt to be embolic off unknown source - MRI brain confirms above strokes - MRA brain: No large vessel disease - CTA head and neck: No large vessel disease. Carotid Dopplers hence canceled. - 2-D echo: Pending - Stroke service recommends TEE to look for embolic source-if positive for PFO, then check bilateral lower extremity venous Dopplers to rule out DVT - Follow hypercoagulable workup: Cardiolipin antibodies, ESR, ANA, HIV, RPR - LDL 153 - HbA1c pending - Did not receive TPA due to rapid resolution of symptoms - Not on antithrombotic prior to admission. Now on aspirin 325 MG daily. She will be enrolled in PARFAIT trial per neurology - No recurrence of symptoms. Deficits have resolved. - Other stroke risk factors, history of 2 miscarriages  Essential hypertension - Controlled  Hyperlipidemia - LDL 153. Goal <70 - Not on statins PTA. No one Lipitor 20 MG daily  Chronic anemia - States that she has always had problem with anemia. She does give history of heavy menstrual bleeding and is currently menstruating. Outpatient follow-up.    DVT prophylaxis: Subcutaneous Lovenox Code Status: Full Family Communication: None at bedside Disposition Plan: DC home possibly 01/24/15   Consultants:  Neurology  Procedures:  None  Antibiotics:  None   Subjective: No recurrence of symptoms. Denies any complaints. Menstruating currently.  Objective: Filed Vitals:   01/23/15 0000 01/23/15 0200  01/23/15 0504 01/23/15 0918  BP: 121/63 112/58 118/57 116/50  Pulse: 74 69 75 82  Temp: 98.1 F (36.7 C) 98.1 F (36.7 C) 98 F (36.7 C) 98.4 F (36.9 C)  TempSrc: Oral Oral Oral Oral  Resp: $Remo'18 18 18 20  'Wvuek$ Height:      Weight:      SpO2: 100% 100% 100% 100%   No intake or output data in the 24 hours ending 01/23/15 1427 Filed Weights   01/22/15 1445  Weight: 87.998 kg (194 lb)     Exam:  General exam: Pleasant young female lying comfortably supine in bed. Respiratory system: Clear. No increased work of breathing. Cardiovascular system: S1 & S2 heard, RRR. No JVD, murmurs, gallops, clicks or pedal edema. Telemetry: Sinus rhythm. Gastrointestinal system: Abdomen is nondistended, soft and nontender. Normal bowel sounds heard. Central nervous system: Alert and oriented. No focal neurological deficits. Extremities: Symmetric 5 x 5 power.   Data Reviewed: Basic Metabolic Panel:  Recent Labs Lab 01/22/15 1155 01/22/15 1203 01/23/15 0257  NA 136 136 140  K 3.6 3.6 3.9  CL 106 104 111  CO2 21*  --  24  GLUCOSE 100* 93 99  BUN $Re'11 13 8  'EhS$ CREATININE 0.93 0.80 0.82  CALCIUM 9.0  --  8.7*   Liver Function Tests:  Recent Labs Lab 01/22/15 1155  AST 22  ALT 18  ALKPHOS 77  BILITOT 0.8  PROT 7.1  ALBUMIN 4.0   No results for input(s): LIPASE, AMYLASE in the last 168 hours. No results for input(s): AMMONIA in the last 168 hours. CBC:  Recent Labs Lab 01/22/15 1155 01/22/15 1203 01/23/15 0257  WBC 6.3  --  5.9  NEUTROABS 3.9  --   --   HGB 12.7 13.6 11.9*  HCT 38.5 40.0 37.0  MCV 89.3  --  89.4  PLT 192  --  190   Cardiac Enzymes: No results for input(s): CKTOTAL, CKMB, CKMBINDEX, TROPONINI in the last 168 hours. BNP (last 3 results) No results for input(s): PROBNP in the last 8760 hours. CBG:  Recent Labs Lab 01/22/15 1208  GLUCAP 93    No results found for this or any previous visit (from the past 240 hour(s)).         Studies: Ct Angio  Head W/cm &/or Wo Cm  01/22/2015  CLINICAL DATA:  Right-sided weakness and dizziness. Code stroke earlier today. Symptoms have now resolved. EXAM: CT ANGIOGRAPHY HEAD AND NECK TECHNIQUE: Multidetector CT imaging of the head and neck was performed using the standard protocol during bolus administration of intravenous contrast. Multiplanar CT image reconstructions and MIPs were obtained to evaluate the vascular anatomy. Carotid stenosis measurements (when applicable) are obtained utilizing NASCET criteria, using the distal internal carotid diameter as the denominator. CONTRAST:  32mL OMNIPAQUE IOHEXOL 350 MG/ML SOLN COMPARISON:  Noncontrast head CT earlier today FINDINGS: CT HEAD Brain: There is no evidence of acute infarct, intracranial hemorrhage, mass, midline shift, or extra-axial fluid collection on this contrast-enhanced CT. Ventricles and sulci are normal. Calvarium and skull base: No fracture or destructive osseous lesion. Paranasal sinuses: Clear. Minimal right maxillary sinus mucosal thickening and osteitis is suggestive of chronic sinusitis. Right maxillary molar periapical lucency as described on CT earlier today. Orbits: Unremarkable. CTA NECK Aortic arch: 3 vessel aortic arch. Brachiocephalic and subclavian arteries are widely patent. Right carotid system: Patent without evidence of stenosis, dissection, or aneurysm. No significant atherosclerosis. Superior thyroid artery incidentally arises at the carotid bifurcation. Left carotid system: Patent without evidence of stenosis, dissection, or aneurysm. No significant atherosclerotic disease. Vertebral arteries: Widely patent and codominant. Skeleton: Mild lower cervical disc degeneration, greatest at C6-7. Other neck: Heterogeneous, asymmetric enlargement of the right thyroid lobe with discrete nodules measuring up to 11 mm in size. CTA HEAD Anterior circulation: Internal carotid arteries are patent from skullbase to carotid termini without stenosis.  ACAs and MCAs are patent without evidence of sizable branch vessel occlusion or significant stenosis. No intracranial aneurysm is identified. Posterior circulation: Intracranial vertebral arteries are widely patent to the basilar. PICA and SCA origins are patent. Basilar artery is patent without stenosis. Posterior communicating arteries are not clearly identified. PCAs are patent without evidence of significant stenosis. Venous sinuses: Patent. Anatomic variants: None. Delayed phase: No abnormal enhancement. IMPRESSION: 1. No evidence of medium or large vessel intracranial arterial occlusion or significant stenosis. 2. Widely patent cervical carotid and vertebral arteries. Electronically Signed   By: Logan Bores M.D.   On: 01/22/2015 14:20   Dg Chest 2 View  01/22/2015  CLINICAL DATA:  Chest pain. EXAM: CHEST  2 VIEW COMPARISON:  None. FINDINGS: The heart size and mediastinal contours are within normal limits. Both lungs are clear. No pneumothorax or pleural effusion is noted. The visualized skeletal structures are unremarkable. IMPRESSION: No active cardiopulmonary disease. Electronically Signed   By: Marijo Conception, M.D.   On: 01/22/2015 15:56   Ct Head Wo Contrast  01/22/2015  CLINICAL DATA:  Code stroke.  Blurred vision.  Right-sided weakness. EXAM: CT HEAD WITHOUT CONTRAST TECHNIQUE: Contiguous axial images were obtained from the base of the skull through the vertex without intravenous contrast. COMPARISON:  None. FINDINGS: Ventricle size is normal. Negative  for acute or chronic infarction. Negative for hemorrhage or fluid collection. Negative for mass or edema. No shift of the midline structures. Calvarium is intact. Right upper third molar periapical lucency compatible with dental infection. IMPRESSION: Normal CT of the brain. Critical Value/emergent results were called by telephone at the time of interpretation on 01/22/2015 at 12:03 pm to Dr. Silverio Decamp , who verbally acknowledged these results.  Electronically Signed   By: Franchot Gallo M.D.   On: 01/22/2015 12:04   Ct Angio Neck W/cm &/or Wo/cm  01/22/2015  CLINICAL DATA:  Right-sided weakness and dizziness. Code stroke earlier today. Symptoms have now resolved. EXAM: CT ANGIOGRAPHY HEAD AND NECK TECHNIQUE: Multidetector CT imaging of the head and neck was performed using the standard protocol during bolus administration of intravenous contrast. Multiplanar CT image reconstructions and MIPs were obtained to evaluate the vascular anatomy. Carotid stenosis measurements (when applicable) are obtained utilizing NASCET criteria, using the distal internal carotid diameter as the denominator. CONTRAST:  24mL OMNIPAQUE IOHEXOL 350 MG/ML SOLN COMPARISON:  Noncontrast head CT earlier today FINDINGS: CT HEAD Brain: There is no evidence of acute infarct, intracranial hemorrhage, mass, midline shift, or extra-axial fluid collection on this contrast-enhanced CT. Ventricles and sulci are normal. Calvarium and skull base: No fracture or destructive osseous lesion. Paranasal sinuses: Clear. Minimal right maxillary sinus mucosal thickening and osteitis is suggestive of chronic sinusitis. Right maxillary molar periapical lucency as described on CT earlier today. Orbits: Unremarkable. CTA NECK Aortic arch: 3 vessel aortic arch. Brachiocephalic and subclavian arteries are widely patent. Right carotid system: Patent without evidence of stenosis, dissection, or aneurysm. No significant atherosclerosis. Superior thyroid artery incidentally arises at the carotid bifurcation. Left carotid system: Patent without evidence of stenosis, dissection, or aneurysm. No significant atherosclerotic disease. Vertebral arteries: Widely patent and codominant. Skeleton: Mild lower cervical disc degeneration, greatest at C6-7. Other neck: Heterogeneous, asymmetric enlargement of the right thyroid lobe with discrete nodules measuring up to 11 mm in size. CTA HEAD Anterior circulation: Internal  carotid arteries are patent from skullbase to carotid termini without stenosis. ACAs and MCAs are patent without evidence of sizable branch vessel occlusion or significant stenosis. No intracranial aneurysm is identified. Posterior circulation: Intracranial vertebral arteries are widely patent to the basilar. PICA and SCA origins are patent. Basilar artery is patent without stenosis. Posterior communicating arteries are not clearly identified. PCAs are patent without evidence of significant stenosis. Venous sinuses: Patent. Anatomic variants: None. Delayed phase: No abnormal enhancement. IMPRESSION: 1. No evidence of medium or large vessel intracranial arterial occlusion or significant stenosis. 2. Widely patent cervical carotid and vertebral arteries. Electronically Signed   By: Logan Bores M.D.   On: 01/22/2015 14:20   Mr Brain Wo Contrast  01/22/2015  ADDENDUM REPORT: 01/22/2015 17:48 ADDENDUM: Study discussed by telephone with Dr. Niel Hummer on 01/22/2015 at 1735 hours. Electronically Signed   By: Genevie Ann M.D.   On: 01/22/2015 17:48  01/22/2015  CLINICAL DATA:  44 year old female with acute onset right visual field changes at 1050 hours today, associated with dizziness. Initial encounter. EXAM: MRI HEAD WITHOUT CONTRAST MRA HEAD WITHOUT CONTRAST TECHNIQUE: Multiplanar, multiecho pulse sequences of the brain and surrounding structures were obtained without intravenous contrast. Angiographic images of the head were obtained using MRA technique without contrast. COMPARISON:  CTA head and neck 1342 hours today. FINDINGS: MRI HEAD FINDINGS There are 2 small foci of restricted diffusion in the left posterior circulation. The larger is in the inferior left occipital lobe seen on  series 3, image 31. There is then a subtle linear area of restricted diffusion in the left superior cerebellum (image 34). Both of these are visible on coronal diffusion imaging also. There is no associated parenchymal T2 or FLAIR  hyperintensity. No associated hemorrhage or mass effect. There is subtle increased FLAIR signal in the left PCA branch visible on series 8, image 11. Major intracranial vascular flow voids are within normal limits. No other restricted diffusion. Pearline Cables and white matter signal elsewhere is within normal limits. No chronic cerebral blood products. No midline shift, mass effect, evidence of mass lesion, ventriculomegaly, extra-axial collection or acute intracranial hemorrhage. Cervicomedullary junction and pituitary are within normal limits. Negative visualized cervical spine. Normal bone marrow signal. Visible internal auditory structures appear normal. Mastoids are clear. Mild paranasal sinus mucosal thickening. Optic chiasm and bilateral intraorbital soft tissues are normal. Negative scalp soft tissues. MRA HEAD FINDINGS Antegrade flow in the posterior circulation with codominant distal vertebral arteries. No distal vertebral artery stenosis. Bilateral PICA flow is present. Normal vertebrobasilar junction. No basilar stenosis. SCA and PCA origins are normal. Posterior communicating arteries are diminutive or absent. Bilateral PCA branches appear normal, including those on the left where subtle increased FLAIR signal was noted. Antegrade flow in both ICA siphons. No siphon stenosis. Normal ophthalmic artery origins. Normal carotid termini, MCA and ACA origins. Diminutive anterior communicating artery. Visualized bilateral ACA branches, and visualized bilateral MCA branches are within normal limits. IMPRESSION: 1. 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 No associated hemorrhage or mass effect. 2.  Negative intracranial MRA. 3. Otherwise normal noncontrast MRI appearance of the brain. Electronically Signed: By: Genevie Ann M.D. On: 01/22/2015 17:27   Mr Jodene Nam Head/brain Wo Cm  01/22/2015  ADDENDUM REPORT: 01/22/2015 17:48 ADDENDUM: Study discussed by telephone with  Dr. Niel Hummer on 01/22/2015 at 1735 hours. Electronically Signed   By: Genevie Ann M.D.   On: 01/22/2015 17:48  01/22/2015  CLINICAL DATA:  44 year old female with acute onset right visual field changes at 1050 hours today, associated with dizziness. Initial encounter. EXAM: MRI HEAD WITHOUT CONTRAST MRA HEAD WITHOUT CONTRAST TECHNIQUE: Multiplanar, multiecho pulse sequences of the brain and surrounding structures were obtained without intravenous contrast. Angiographic images of the head were obtained using MRA technique without contrast. COMPARISON:  CTA head and neck 1342 hours today. FINDINGS: MRI HEAD FINDINGS There are 2 small foci of restricted diffusion in the left posterior circulation. The larger is in the inferior left occipital lobe seen on series 3, image 31. There is then a subtle linear area of restricted diffusion in the left superior cerebellum (image 34). Both of these are visible on coronal diffusion imaging also. There is no associated parenchymal T2 or FLAIR hyperintensity. No associated hemorrhage or mass effect. There is subtle increased FLAIR signal in the left PCA branch visible on series 8, image 11. Major intracranial vascular flow voids are within normal limits. No other restricted diffusion. Pearline Cables and white matter signal elsewhere is within normal limits. No chronic cerebral blood products. No midline shift, mass effect, evidence of mass lesion, ventriculomegaly, extra-axial collection or acute intracranial hemorrhage. Cervicomedullary junction and pituitary are within normal limits. Negative visualized cervical spine. Normal bone marrow signal. Visible internal auditory structures appear normal. Mastoids are clear. Mild paranasal sinus mucosal thickening. Optic chiasm and bilateral intraorbital soft tissues are normal. Negative scalp soft tissues. MRA HEAD FINDINGS Antegrade flow in the posterior circulation with codominant distal vertebral arteries. No  distal vertebral artery  stenosis. Bilateral PICA flow is present. Normal vertebrobasilar junction. No basilar stenosis. SCA and PCA origins are normal. Posterior communicating arteries are diminutive or absent. Bilateral PCA branches appear normal, including those on the left where subtle increased FLAIR signal was noted. Antegrade flow in both ICA siphons. No siphon stenosis. Normal ophthalmic artery origins. Normal carotid termini, MCA and ACA origins. Diminutive anterior communicating artery. Visualized bilateral ACA branches, and visualized bilateral MCA branches are within normal limits. IMPRESSION: 1. 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 No associated hemorrhage or mass effect. 2.  Negative intracranial MRA. 3. Otherwise normal noncontrast MRI appearance of the brain. Electronically Signed: By: Genevie Ann M.D. On: 01/22/2015 17:27        Scheduled Meds: . aspirin  300 mg Rectal Daily   Or  . aspirin  325 mg Oral Daily  . atorvastatin  20 mg Oral q1800  . enoxaparin (LOVENOX) injection  40 mg Subcutaneous Q24H   Continuous Infusions: . sodium chloride 100 mL/hr at 01/22/15 1843    Active Problems:   TIA (transient ischemic attack)   HTN (hypertension)   Hyperlipidemia    Time spent: 25 minutes    HONGALGI,ANAND, MD, FACP, FHM. [facility] Pager [phone]  If 7PM-7AM, please contact night-coverage [email or web address] Password [email or web address] 01/23/2015, 2:27 PM

## 2015-01-23 NOTE — Progress Notes (Signed)
Occupational Therapy Evaluation Patient Details Name: Tiffany Gardner MRN: [id number] DOB: 1970/11/28 Today's Date: 01/23/2015    History of Present Illness Tiffany Gardner is a 44 y.o. female with PMH significant for HTN, HLD who presents with Right eye blurry vision, and right leg weakness, patient symptoms resolved, didn't required TPA   Clinical Impression   Pt states she is at her baseline and has no residual symptoms. Pt completed all ADL and functional mobility independently. Pt with no further acute OT needs. OT signing off.    Follow Up Recommendations  No OT follow up    Equipment Recommendations       Recommendations for Other Services       Precautions / Restrictions Precautions Precautions: None Restrictions Weight Bearing Restrictions: No      Mobility Bed Mobility Overal bed mobility: Independent                Transfers Overall transfer level: Independent                    Balance Overall balance assessment: Independent Sitting-balance support: No upper extremity supported;Feet supported Sitting balance-Leahy Scale: Normal     Standing balance support: No upper extremity supported;During functional activity Standing balance-Leahy Scale: Normal                              ADL Overall ADL's : Independent                 Upper Body Dressing : Independent;Standing   Lower Body Dressing: Independent;Sit to/from stand   Toilet Transfer: Independent;Ambulation;Regular Toilet   Toileting- Water quality scientist and Hygiene: Independent;Sit to/from stand   Tub/ Banker: Walk-in shower;Independent;Ambulation   Functional mobility during ADLs: Independent General ADL Comments: Pt completed sit-stand, toilet, and shower transfer with mod I (increased time) with no LOB or disturbances of vision. Pt educated and verbalized understanding of signs and symptoms of stroke.      Vision Vision Assessment?: Yes Eye  Alignment: Within Functional Limits Ocular Range of Motion: Within Functional Limits Alignment/Gaze Preference: Within Defined Limits Tracking/Visual Pursuits: Able to track stimulus in all quads without difficulty Saccades: Within functional limits Convergence: Within functional limits Visual Fields: No apparent deficits   Perception     Praxis      Pertinent Vitals/Pain Pain Assessment: No/denies pain     Hand Dominance Right   Extremity/Trunk Assessment Upper Extremity Assessment Upper Extremity Assessment: Overall WFL for tasks assessed   Lower Extremity Assessment Lower Extremity Assessment: Overall WFL for tasks assessed   Cervical / Trunk Assessment Cervical / Trunk Assessment: Normal   Communication Communication Communication: No difficulties   Cognition Arousal/Alertness: Awake/alert Behavior During Therapy: WFL for tasks assessed/performed Overall Cognitive Status: Within Functional Limits for tasks assessed                     General Comments       Exercises       Shoulder Instructions      Home Living Family/patient expects to be discharged to:: Private residence Living Arrangements: Children (Daughter) Available Help at Discharge: Family;Available PRN/intermittently Type of Home: House Home Access: Stairs to enter [employer] of Steps: 3 Entrance Stairs-Rails: Left Home Layout: Two level;Bed/bath upstairs Alternate Level Stairs-Number of Steps: 15 Alternate Level Stairs-Rails: Left Bathroom Shower/Tub: Walk-in shower;Door   [employer] Toilet: Standard     Home Equipment: None  Prior Functioning/Environment Level of Independence: Independent        Comments: works as Physiological scientist Diagnosis:     OT Problem List:     OT Treatment/Interventions:      OT Goals(Current goals can be found in the care plan section) Acute Rehab OT Goals Patient Stated Goal: to go home OT Goal Formulation: With  patient Time For Goal Achievement: 02/06/15 Potential to Achieve Goals: Good  OT Frequency:     Barriers to D/C:            Co-evaluation              End of Session Nurse Communication: Mobility status  Activity Tolerance: Patient tolerated treatment well Patient left: in bed;with call bell/phone within reach   Time: [id number] OT Time Calculation (min): 11 min Charges:  OT Treatments $Self Care/Home Management : 8-22 mins G-Codes: OT G-codes **NOT FOR INPATIENT CLASS** Functional Assessment Tool Used: Clinical judgement Self Care Current Status [id number]): 0 percent impaired, limited or restricted Self Care Goal Status [id number]): 0 percent impaired, limited or restricted Self Care Discharge Status [id number]): 0 percent impaired, limited or restricted  Redmond Baseman 01/23/2015, 10:43 AM

## 2015-01-24 ENCOUNTER — Encounter (HOSPITAL_COMMUNITY): Payer: Self-pay | Admitting: *Deleted

## 2015-01-24 ENCOUNTER — Encounter (HOSPITAL_COMMUNITY)
Admission: EM | Disposition: A | Discharge status: Home / Self Care | Payer: Self-pay | Source: Home / Self Care | Attending: Internal Medicine

## 2015-01-24 ENCOUNTER — Inpatient Hospital Stay (HOSPITAL_COMMUNITY): Payer: BLUE CROSS/BLUE SHIELD

## 2015-01-24 DIAGNOSIS — I639 Cerebral infarction, unspecified: Secondary | ICD-10-CM

## 2015-01-24 DIAGNOSIS — Q211 Atrial septal defect: Secondary | ICD-10-CM

## 2015-01-24 HISTORY — PX: TEE WITHOUT CARDIOVERSION: SHX5443

## 2015-01-24 LAB — CBC
HEMATOCRIT: 38.7 % (ref 36.0–46.0)
HEMOGLOBIN: 12.7 g/dL (ref 12.0–15.0)
MCH: 29.6 pg (ref 26.0–34.0)
MCHC: 32.8 g/dL (ref 30.0–36.0)
MCV: 90.2 fL (ref 78.0–100.0)
Platelets: 189 10*3/uL (ref 150–400)
RBC: 4.29 MIL/uL (ref 3.87–5.11)
RDW: 13.6 % (ref 11.5–15.5)
WBC: 5.9 10*3/uL (ref 4.0–10.5)

## 2015-01-24 LAB — HEMOGLOBIN A1C
Hgb A1c MFr Bld: 6.1 % — ABNORMAL HIGH (ref 4.8–5.6)
Mean Plasma Glucose: 128 mg/dL

## 2015-01-24 LAB — RPR: RPR: NONREACTIVE

## 2015-01-24 LAB — SEDIMENTATION RATE: Sed Rate: 40 mm/hr — ABNORMAL HIGH (ref 0–22)

## 2015-01-24 LAB — HIV ANTIBODY (ROUTINE TESTING W REFLEX): HIV Screen 4th Generation wRfx: NONREACTIVE

## 2015-01-24 SURGERY — ECHOCARDIOGRAM, TRANSESOPHAGEAL
Anesthesia: Moderate Sedation

## 2015-01-24 MED ORDER — MIDAZOLAM HCL 10 MG/2ML IJ SOLN
INTRAMUSCULAR | Status: DC | PRN
  Administered 2015-01-24 (×2): 2 mg via INTRAVENOUS
  Administered 2015-01-24: 1 mg via INTRAVENOUS

## 2015-01-24 MED ORDER — LIDOCAINE VISCOUS 2 % MT SOLN
OROMUCOSAL | Status: AC
  Filled 2015-01-24: qty 15

## 2015-01-24 MED ORDER — MIDAZOLAM HCL 5 MG/ML IJ SOLN
INTRAMUSCULAR | Status: AC
  Filled 2015-01-24: qty 2

## 2015-01-24 MED ORDER — SODIUM CHLORIDE 0.9 % IV SOLN
INTRAVENOUS | Status: DC
Start: 2015-01-24 — End: 2015-01-24
  Administered 2015-01-24: 500 mL via INTRAVENOUS

## 2015-01-24 MED ORDER — FENTANYL CITRATE (PF) 100 MCG/2ML IJ SOLN
INTRAMUSCULAR | Status: DC | PRN
  Administered 2015-01-24: 25 ug via INTRAVENOUS
  Administered 2015-01-24: 50 ug via INTRAVENOUS

## 2015-01-24 MED ORDER — FENTANYL CITRATE (PF) 100 MCG/2ML IJ SOLN
INTRAMUSCULAR | Status: AC
  Filled 2015-01-24: qty 2

## 2015-01-24 MED ORDER — BUTAMBEN-TETRACAINE-BENZOCAINE 2-2-14 % EX AERO
INHALATION_SPRAY | CUTANEOUS | Status: DC | PRN
Start: 2015-01-24 — End: 2015-01-24
  Administered 2015-01-24: 2 via TOPICAL

## 2015-01-24 MED ORDER — LIDOCAINE VISCOUS 2 % MT SOLN
OROMUCOSAL | Status: DC | PRN
  Administered 2015-01-24: 1 via OROMUCOSAL

## 2015-01-24 NOTE — Evaluation (Signed)
Speech Language Pathology Evaluation Patient Details Name: Tiffany Gardner MRN: [id number] DOB: 08/13/1970 Today's Date: 01/24/2015 Time: [id number] SLP Time Calculation (min) (ACUTE ONLY): 14 min  Problem List:  Patient Active Problem List   Diagnosis Date Noted  . Acute CVA (cerebrovascular accident) ([place]) 01/23/2015  . Acute embolic stroke ([place])   . TIA (transient ischemic attack) 01/22/2015  . Hyperlipidemia 01/22/2015   Past Medical History:  Past Medical History  Diagnosis Date  . Medical history non-contributory    Past Surgical History: History reviewed. No pertinent past surgical history. HPI:  Tiffany Gardner is a 44 y.o. female with PMH significant for HTN, HLD who presents with Right eye blurry vision, and right leg weakness, patient symptoms resolved, didn't required TPA   Assessment / Plan / Recommendation Clinical Impression  Pt scored a 28/30 on the MoCA, indicating cognitive-linguistic function that is [facility]. Speech is clear at the conversational level. No acute needs identified, will sign off.    SLP Assessment  Patient does not need any further Speech Lanaguage Pathology Services    Follow Up Recommendations  None    Frequency and Duration           SLP Evaluation Prior Functioning  Cognitive/Linguistic Baseline: Within functional limits Type of Home: House  Lives With: Daughter (46 yo) Available Help at Discharge: Family;Available PRN/intermittently Vocation: Full time employment   Cognition  Overall Cognitive Status: Within Functional Limits for tasks assessed Orientation Level: Oriented X4    Comprehension  Auditory Comprehension Overall Auditory Comprehension: Appears within functional limits for tasks assessed Visual Recognition/Discrimination Discrimination: Within Function Limits    Expression Expression Primary Mode of Expression: Verbal Verbal Expression Overall Verbal Expression: Appears within functional limits for tasks assessed   Oral /  Motor Oral Motor/Sensory Function Overall Oral Motor/Sensory Function: Within functional limits Motor Speech Overall Motor Speech: Appears within functional limits for tasks assessed    Germain Osgood, M.A. CCC-SLP [phone]  Germain Osgood 01/24/2015, 11:09 AM

## 2015-01-24 NOTE — H&P (Signed)
    INTERVAL PROCEDURE H&P  History and Physical Interval Note:  01/24/2015 8:04 AM  Tiffany Gardner has presented today for their planned procedure. The various methods of treatment have been discussed with the patient and family. After consideration of risks, benefits and other options for treatment, the patient has consented to the procedure.  The patients' outpatient history has been reviewed, patient examined, and no change in status from most recent office note within the past 30 days. I have reviewed the patients' chart and labs and will proceed as planned. Questions were answered to the patient's satisfaction.   Pixie Casino, MD, [facility] Attending Cardiologist [place] Chrisann Melaragno 01/24/2015, 8:04 AM

## 2015-01-24 NOTE — Progress Notes (Signed)
PROGRESS NOTE    [name] OTR:[id number] DOB: 12-25-1970 DOA: 01/22/2015 PCP: No primary care provider on file.  HPI/Brief narrative 44 year old female patient, PMH of HTN, HLD, chronic anemia, presented to [facility] ED on 01/22/15 with transient right-sided weakness and blurred vision in right eye. Workup confirms left occipital and cerebellar infarcts. Stroke service consulting. Workup ongoing. TEE confirmed large ASD. Cardiology and thoracic surgery consulted to evaluate regarding closure/repair of ASD.   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 - Etiology: Felt to be embolic off unknown source - MRI brain confirms above strokes - MRA brain: No large vessel disease - CTA head and neck: No large vessel disease. Carotid Dopplers hence canceled. - TEE 01/24/15: Confirms ASD-detailed results as below. Cardiology and thoracic surgery consulted. - Check bilateral lower extremity venous Dopplers to rule out DVT. - Follow hypercoagulable workup: Cardiolipin antibodies, ESR (40), ANA, HIV, RPR - LDL 153 - HbA1c: 6.1 - Did not receive TPA due to rapid resolution of symptoms - Not on antithrombotic prior to admission. Now on aspirin 325 MG daily. She will be enrolled in PARFAIT trial per neurology - No recurrence of symptoms. Deficits have resolved. - Other stroke risk factors, history of 2 miscarriages - Discussed with neurology.  Essential hypertension - Controlled  Hyperlipidemia - LDL 153. Goal <70 - Not on statins PTA. Now on Lipitor 20 MG daily  Chronic anemia - States that she has always had problem with anemia. She does give history of heavy menstrual bleeding and is currently menstruating. Outpatient follow-up. - Hemoglobin normal at 12.7.    DVT prophylaxis: Subcutaneous Lovenox Code Status: Full Family Communication: None at bedside Disposition Plan: DC home pending clearance by Cards/TCTS post  evaluation.   Consultants:  Neurology  Cardiology  Cardiothoracic surgery  Procedures:  TEE 12/1: Study Conclusions  - Left ventricle: The cavity size was normal. Wall thickness was normal. Systolic function was normal. The estimated ejection fraction was in the range of 55% to 60%. Wall motion was normal; there were no regional wall motion abnormalities. - Aortic valve: No evidence of vegetation. - Mitral valve: No evidence of vegetation. - Left atrium: No evidence of thrombus in the atrial cavity or appendage. - Right ventricle: Mildly dilated. - Right atrium: The atrium was dilated. 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 There are 2 separate secundum atrial septal defects, with moderate left to right flow by color doppler seen in 2D and 3D imaging. They are in the inferior and medial portions of the atrial septum. Right to left flow by saline microbubble contrast was not noted, even with valsalva. - Pulmonic valve: No evidence of vegetation.  Impressions:  - Atrial septal aneurysm with secundum ASD- there are signs of right heart enlargement. Consider percutaneous or surgical closure evaluation.  Antibiotics:  None   Subjective: No recurrence of symptoms. Denies any complaints.   Objective: Filed Vitals:   01/24/15 0841 01/24/15 0850 01/24/15 0900 01/24/15 1015  BP: 121/57 122/56 119/50 113/62  Pulse: 76 69  69  Temp: 98 F (36.7 C)   98.1 F (36.7 C)  TempSrc: Oral   Oral  Resp: $Remo'18 16  17  'vUdQD$ Height:      Weight:      SpO2: 96% 97%  100%    Intake/Output Summary (Last 24 hours) at 01/24/15 1252 Last data filed at 01/24/15 0857  Gross per 24 hour  Intake    200 ml  Output  0 ml  Net    200 ml   Filed Weights   01/22/15 1445  Weight: 87.998 kg (194 lb)     Exam:  General exam: Pleasant young female sitting up comfortably in bed. Seen post TEE. Respiratory system: Clear. No increased work of  breathing. Cardiovascular system: S1 & S2 heard, RRR. No JVD, murmurs, gallops, clicks or pedal edema. Telemetry: Sinus rhythm. Gastrointestinal system: Abdomen is nondistended, soft and nontender. Normal bowel sounds heard. Central nervous system: Alert and oriented. No focal neurological deficits. Extremities: Symmetric 5 x 5 power.   Data Reviewed: Basic Metabolic Panel:  Recent Labs Lab 01/22/15 1155 01/22/15 1203 01/23/15 0257  NA 136 136 140  K 3.6 3.6 3.9  CL 106 104 111  CO2 21*  --  24  GLUCOSE 100* 93 99  BUN $Re'11 13 8  'xTe$ CREATININE 0.93 0.80 0.82  CALCIUM 9.0  --  8.7*   Liver Function Tests:  Recent Labs Lab 01/22/15 1155  AST 22  ALT 18  ALKPHOS 77  BILITOT 0.8  PROT 7.1  ALBUMIN 4.0   No results for input(s): LIPASE, AMYLASE in the last 168 hours. No results for input(s): AMMONIA in the last 168 hours. CBC:  Recent Labs Lab 01/22/15 1155 01/22/15 1203 01/23/15 0257 01/24/15 0529  WBC 6.3  --  5.9 5.9  NEUTROABS 3.9  --   --   --   HGB 12.7 13.6 11.9* 12.7  HCT 38.5 40.0 37.0 38.7  MCV 89.3  --  89.4 90.2  PLT 192  --  190 189   Cardiac Enzymes: No results for input(s): CKTOTAL, CKMB, CKMBINDEX, TROPONINI in the last 168 hours. BNP (last 3 results) No results for input(s): PROBNP in the last 8760 hours. CBG:  Recent Labs Lab 01/22/15 1208  GLUCAP 93    No results found for this or any previous visit (from the past 240 hour(s)).         Studies: Ct Angio Head W/cm &/or Wo Cm  01/22/2015  CLINICAL DATA:  Right-sided weakness and dizziness. Code stroke earlier today. Symptoms have now resolved. EXAM: CT ANGIOGRAPHY HEAD AND NECK TECHNIQUE: Multidetector CT imaging of the head and neck was performed using the standard protocol during bolus administration of intravenous contrast. Multiplanar CT image reconstructions and MIPs were obtained to evaluate the vascular anatomy. Carotid stenosis measurements (when applicable) are obtained  utilizing NASCET criteria, using the distal internal carotid diameter as the denominator. CONTRAST:  50mL OMNIPAQUE IOHEXOL 350 MG/ML SOLN COMPARISON:  Noncontrast head CT earlier today FINDINGS: CT HEAD Brain: There is no evidence of acute infarct, intracranial hemorrhage, mass, midline shift, or extra-axial fluid collection on this contrast-enhanced CT. Ventricles and sulci are normal. Calvarium and skull base: No fracture or destructive osseous lesion. Paranasal sinuses: Clear. Minimal right maxillary sinus mucosal thickening and osteitis is suggestive of chronic sinusitis. Right maxillary molar periapical lucency as described on CT earlier today. Orbits: Unremarkable. CTA NECK Aortic arch: 3 vessel aortic arch. Brachiocephalic and subclavian arteries are widely patent. Right carotid system: Patent without evidence of stenosis, dissection, or aneurysm. No significant atherosclerosis. Superior thyroid artery incidentally arises at the carotid bifurcation. Left carotid system: Patent without evidence of stenosis, dissection, or aneurysm. No significant atherosclerotic disease. Vertebral arteries: Widely patent and codominant. Skeleton: Mild lower cervical disc degeneration, greatest at C6-7. Other neck: Heterogeneous, asymmetric enlargement of the right thyroid lobe with discrete nodules measuring up to 11 mm in size. CTA HEAD Anterior circulation: Internal carotid  arteries are patent from skullbase to carotid termini without stenosis. ACAs and MCAs are patent without evidence of sizable branch vessel occlusion or significant stenosis. No intracranial aneurysm is identified. Posterior circulation: Intracranial vertebral arteries are widely patent to the basilar. PICA and SCA origins are patent. Basilar artery is patent without stenosis. Posterior communicating arteries are not clearly identified. PCAs are patent without evidence of significant stenosis. Venous sinuses: Patent. Anatomic variants: None. Delayed  phase: No abnormal enhancement. IMPRESSION: 1. No evidence of medium or large vessel intracranial arterial occlusion or significant stenosis. 2. Widely patent cervical carotid and vertebral arteries. Electronically Signed   By: Logan Bores M.D.   On: 01/22/2015 14:20   Dg Chest 2 View  01/22/2015  CLINICAL DATA:  Chest pain. EXAM: CHEST  2 VIEW COMPARISON:  None. FINDINGS: The heart size and mediastinal contours are within normal limits. Both lungs are clear. No pneumothorax or pleural effusion is noted. The visualized skeletal structures are unremarkable. IMPRESSION: No active cardiopulmonary disease. Electronically Signed   By: Marijo Conception, M.D.   On: 01/22/2015 15:56   Ct Angio Neck W/cm &/or Wo/cm  01/22/2015  CLINICAL DATA:  Right-sided weakness and dizziness. Code stroke earlier today. Symptoms have now resolved. EXAM: CT ANGIOGRAPHY HEAD AND NECK TECHNIQUE: Multidetector CT imaging of the head and neck was performed using the standard protocol during bolus administration of intravenous contrast. Multiplanar CT image reconstructions and MIPs were obtained to evaluate the vascular anatomy. Carotid stenosis measurements (when applicable) are obtained utilizing NASCET criteria, using the distal internal carotid diameter as the denominator. CONTRAST:  73mL OMNIPAQUE IOHEXOL 350 MG/ML SOLN COMPARISON:  Noncontrast head CT earlier today FINDINGS: CT HEAD Brain: There is no evidence of acute infarct, intracranial hemorrhage, mass, midline shift, or extra-axial fluid collection on this contrast-enhanced CT. Ventricles and sulci are normal. Calvarium and skull base: No fracture or destructive osseous lesion. Paranasal sinuses: Clear. Minimal right maxillary sinus mucosal thickening and osteitis is suggestive of chronic sinusitis. Right maxillary molar periapical lucency as described on CT earlier today. Orbits: Unremarkable. CTA NECK Aortic arch: 3 vessel aortic arch. Brachiocephalic and subclavian  arteries are widely patent. Right carotid system: Patent without evidence of stenosis, dissection, or aneurysm. No significant atherosclerosis. Superior thyroid artery incidentally arises at the carotid bifurcation. Left carotid system: Patent without evidence of stenosis, dissection, or aneurysm. No significant atherosclerotic disease. Vertebral arteries: Widely patent and codominant. Skeleton: Mild lower cervical disc degeneration, greatest at C6-7. Other neck: Heterogeneous, asymmetric enlargement of the right thyroid lobe with discrete nodules measuring up to 11 mm in size. CTA HEAD Anterior circulation: Internal carotid arteries are patent from skullbase to carotid termini without stenosis. ACAs and MCAs are patent without evidence of sizable branch vessel occlusion or significant stenosis. No intracranial aneurysm is identified. Posterior circulation: Intracranial vertebral arteries are widely patent to the basilar. PICA and SCA origins are patent. Basilar artery is patent without stenosis. Posterior communicating arteries are not clearly identified. PCAs are patent without evidence of significant stenosis. Venous sinuses: Patent. Anatomic variants: None. Delayed phase: No abnormal enhancement. IMPRESSION: 1. No evidence of medium or large vessel intracranial arterial occlusion or significant stenosis. 2. Widely patent cervical carotid and vertebral arteries. Electronically Signed   By: Logan Bores M.D.   On: 01/22/2015 14:20   Mr Brain Wo Contrast  01/22/2015  ADDENDUM REPORT: 01/22/2015 17:48 ADDENDUM: Study discussed by telephone with Dr. Niel Hummer on 01/22/2015 at 1735 hours. Electronically Signed   By: Lemmie Evens  [place] Crane M.D.   On: 01/22/2015 17:48  01/22/2015  CLINICAL DATA:  44 year old female with acute onset right visual field changes at 1050 hours today, associated with dizziness. Initial encounter. EXAM: MRI HEAD WITHOUT CONTRAST MRA HEAD WITHOUT CONTRAST TECHNIQUE: Multiplanar, multiecho pulse  sequences of the brain and surrounding structures were obtained without intravenous contrast. Angiographic images of the head were obtained using MRA technique without contrast. COMPARISON:  CTA head and neck 1342 hours today. FINDINGS: MRI HEAD FINDINGS There are 2 small foci of restricted diffusion in the left posterior circulation. The larger is in the inferior left occipital lobe seen on series 3, image 31. There is then a subtle linear area of restricted diffusion in the left superior cerebellum (image 34). Both of these are visible on coronal diffusion imaging also. There is no associated parenchymal T2 or FLAIR hyperintensity. No associated hemorrhage or mass effect. There is subtle increased FLAIR signal in the left PCA branch visible on series 8, image 11. Major intracranial vascular flow voids are within normal limits. No other restricted diffusion. [place] Cables and white matter signal elsewhere is within normal limits. No chronic cerebral blood products. No midline shift, mass effect, evidence of mass lesion, ventriculomegaly, extra-axial collection or acute intracranial hemorrhage. Cervicomedullary junction and pituitary are within normal limits. Negative visualized cervical spine. Normal bone marrow signal. Visible internal auditory structures appear normal. Mastoids are clear. Mild paranasal sinus mucosal thickening. Optic chiasm and bilateral intraorbital soft tissues are normal. Negative scalp soft tissues. MRA HEAD FINDINGS Antegrade flow in the posterior circulation with codominant distal vertebral arteries. No distal vertebral artery stenosis. Bilateral PICA flow is present. Normal vertebrobasilar junction. No basilar stenosis. SCA and PCA origins are normal. Posterior communicating arteries are diminutive or absent. Bilateral PCA branches appear normal, including those on the left where subtle increased FLAIR signal was noted. Antegrade flow in both ICA siphons. No siphon stenosis. Normal ophthalmic  artery origins. Normal carotid termini, MCA and ACA origins. Diminutive anterior communicating artery. Visualized bilateral ACA branches, and visualized bilateral MCA branches are within normal limits. IMPRESSION: 1. 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 No associated hemorrhage or mass effect. 2.  Negative intracranial MRA. 3. Otherwise normal noncontrast MRI appearance of the brain. Electronically Signed: By: Genevie Ann M.D. On: 01/22/2015 17:27   Mr Jodene Nam Head/brain Wo Cm  01/22/2015  ADDENDUM REPORT: 01/22/2015 17:48 ADDENDUM: Study discussed by telephone with Dr. Niel Hummer on 01/22/2015 at 1735 hours. Electronically Signed   By: Genevie Ann M.D.   On: 01/22/2015 17:48  01/22/2015  CLINICAL DATA:  44 year old female with acute onset right visual field changes at 1050 hours today, associated with dizziness. Initial encounter. EXAM: MRI HEAD WITHOUT CONTRAST MRA HEAD WITHOUT CONTRAST TECHNIQUE: Multiplanar, multiecho pulse sequences of the brain and surrounding structures were obtained without intravenous contrast. Angiographic images of the head were obtained using MRA technique without contrast. COMPARISON:  CTA head and neck 1342 hours today. FINDINGS: MRI HEAD FINDINGS There are 2 small foci of restricted diffusion in the left posterior circulation. The larger is in the inferior left occipital lobe seen on series 3, image 31. There is then a subtle linear area of restricted diffusion in the left superior cerebellum (image 34). Both of these are visible on coronal diffusion imaging also. There is no associated parenchymal T2 or FLAIR hyperintensity. No associated hemorrhage or mass effect. There is subtle increased FLAIR signal in the left PCA branch visible on series  8, image 11. Major intracranial vascular flow voids are within normal limits. No other restricted diffusion. Pear[place] Cables and white matter signal elsewhere is within normal limits. No chronic  cerebral blood products. No midline shift, mass effect, evidence of mass lesion, ventriculomegaly, extra-axial collection or acute intracranial hemorrhage. Cervicomedullary junction and pituitary are within normal limits. Negative visualized cervical spine. Normal bone marrow signal. Visible internal auditory structures appear normal. Mastoids are clear. Mild paranasal sinus mucosal thickening. Optic chiasm and bilateral intraorbital soft tissues are normal. Negative scalp soft tissues. MRA HEAD FINDINGS Antegrade flow in the posterior circulation with codominant distal vertebral arteries. No distal vertebral artery stenosis. Bilateral PICA flow is present. Normal vertebrobasilar junction. No basilar stenosis. SCA and PCA origins are normal. Posterior communicating arteries are diminutive or absent. Bilateral PCA branches appear normal, including those on the left where subtle increased FLAIR signal was noted. Antegrade flow in both ICA siphons. No siphon stenosis. Normal ophthalmic artery origins. Normal carotid termini, MCA and ACA origins. Diminutive anterior communicating artery. Visualized bilateral ACA branches, and visualized bilateral MCA branches are within normal limits. IMPRESSION: 1. 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 No associated hemorrhage or mass effect. 2.  Negative intracranial MRA. 3. Otherwise normal noncontrast MRI appearance of the brain. Electronically Signed: By: Genevie Ann M.D. On: 01/22/2015 17:27        Scheduled Meds: . aspirin  325 mg Oral Daily  . atorvastatin  20 mg Oral q1800  . enoxaparin (LOVENOX) injection  40 mg Subcutaneous Q24H   Continuous Infusions:    Active Problems:   TIA (transient ischemic attack)   Hyperlipidemia   Acute embolic stroke ([place])   Acute CVA (cerebrovascular accident) ([place])    Time spent: 44 minutes    Romin Divita, MD, FACP, FHM. [facility] Pager [phone]  If  7PM-7AM, please contact night-coverage [email or web address] Password [email or web address] 01/24/2015, 12:52 PM    LOS: 1 day

## 2015-01-24 NOTE — CV Procedure (Signed)
TRANSESOPHAGEAL ECHOCARDIOGRAM (TEE) NOTE  INDICATIONS: stroke  PROCEDURE:   Informed consent was obtained prior to the procedure. The risks, benefits and alternatives for the procedure were discussed and the patient comprehended these risks.  Risks include, but are not limited to, cough, sore throat, vomiting, nausea, somnolence, esophageal and stomach trauma or perforation, bleeding, low blood pressure, aspiration, pneumonia, infection, trauma to the teeth and death.    After a procedural time-out, the patient was given 5 mg versed and 75 mcg fentanyl for moderate sedation.  The oropharynx was anesthetized 10 cc of topical 1% viscous lidocaine and 2 cetacaine sprays.  The transesophageal probe was inserted in the esophagus and stomach without difficulty and multiple views were obtained.  The patient was kept under observation until the patient left the procedure room.  The patient left the procedure room in stable condition.   Agitated microbubble saline contrast was administered.  COMPLICATIONS:    There were no immediate complications.  Findings:  1. LEFT VENTRICLE: The left ventricular wall thickness is normal.  The left ventricular cavity is normal in size. Wall motion is normal.  LVEF is 55-60%.  2. RIGHT VENTRICLE:  The right ventricle is normal in structure and function without any thrombus or masses.    3. LEFT ATRIUM:  The left atrium is normal in size without any thrombus or masses.  There is not spontaneous echo contrast ("smoke") in the left atrium consistent with a low flow state.  4. LEFT ATRIAL APPENDAGE:  The left atrial appendage is free of any thrombus or masses. The appendage has single lobes. Pulse doppler indicates high flow in the appendage.  5. ATRIAL SEPTUM:  Well-visualized in 2D and 3D views. The atrial septum is duplicated with a type 1R aneurysm. 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  There  is no evidence for right to left flow with saline microbubble, even with valsalva, likely due to the significant left to right jets (there is negative contrast around the septum when the RA was opacified with contrast).  6. RIGHT ATRIUM:  The right atrium is moderately dilated in size without any thrombus or masses.  7. MITRAL VALVE:  The mitral valve is normal in structure and function with trivial regurgitation.  There were no vegetations or stenosis.  8. AORTIC VALVE:  The aortic valve is trileaflet, normal in structure and function with no regurgitation.  There were no vegetations or stenosis  9. TRICUSPID VALVE:  The tricuspid valve is normal in structure and function with trivial regurgitation.  There were no vegetations or stenosis  10.  PULMONIC VALVE:  The pulmonic valve is normal in structure and function with trivial regurgitation.  There were no vegetations or stenosis.   11. AORTIC ARCH, ASCENDING AND DESCENDING AORTA:  There was no Ron Parker et. Al, 1992) atherosclerosis of the ascending aorta, aortic arch, or proximal descending aorta.  12. PULMONARY VEINS: Anomalous pulmonary venous return was not noted.  13. PERICARDIUM: The pericardium appeared normal and non-thickened.  There is no pericardial effusion.  IMPRESSION:   1. Moderate sized secundum ASD with 2 distinct jets and predominantly left to right flow by color doppler. 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 Right to left flow by saline microbubble contrast was not elicited. 2. Right atrial enlargement. 3. No LAA thrombus. 4. No significant valvular disease. 5. Normal LVEF of 55-60%  RECOMMENDATIONS:    1. Moderate-sized secundum ASD  with 2 distinct orifaces in the inferior and midportion of the septum with moderate left to right flow. There appears to be right atrial enlargement which may indicate hemodynamic significance of the ASD. Device closure could be considered, however, there are 2 ostia which  would be more challenging from a device closure perspective. There is a small rim between the ostia, however, very little rim inferior to the more caudal ostium. Surgical closure may be an better option to entertain. 2. Please note- the patient mentioned that she has never had high blood pressure, does not take blood pressure medicine and would like to strike out the diagnosis of hypertension.  Time Spent Directly with the Patient:  45 minutes   Pixie Casino, MD, [facility] Attending Cardiologist [facility]  01/24/2015, 8:36 AM

## 2015-01-24 NOTE — Progress Notes (Signed)
Echocardiogram Echocardiogram Transesophageal has been performed.  [name] 01/24/2015, 8:59 AM

## 2015-01-24 NOTE — Progress Notes (Signed)
STROKE TEAM PROGRESS NOTE   HISTORY Tiffany Gardner is an 44 y.o. female who was at baseline while at work today. At 10:50 she had dropped her key in her right hand and attempted to reach down to get them. She could not see the keys in her right visual field. She then stood in one place as she felt her right side did not feel right and she was dizzy. Coworkers noted something was off and drove her to the ED. On time of arrival her symptoms resolved. She has no PMHx and takes no medications. Modified Rankin: Rankin Score=0. She was last known well 01/22/2015 at 10:50.  Patient was not administered TPA secondary to symptoms resolved. She was admitted for further evaluation and treatment.   SUBJECTIVE (INTERVAL HISTORY) No family is at the bedside.  Overall she feels her condition is rapidly improving. 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 Discuss with cardiologist Dr Debara Pickett and he is not sure if patient's ASD is am amenable to endovascular closure and may need open heart surgery OBJECTIVE Temp:  [97.6 F (36.4 C)-98.7 F (37.1 C)] 98.1 F (36.7 C) (12/01 1015) Pulse Rate:  [65-84] 69 (12/01 1015) Cardiac Rhythm:  [-] Normal sinus rhythm (12/01 0720) Resp:  [15-24] 17 (12/01 1015) BP: (109-133)/(46-71) 113/62 mmHg (12/01 1015) SpO2:  [96 %-100 %] 100 % (12/01 1015)  CBC:  Recent Labs Lab 01/22/15 1155  01/23/15 0257 01/24/15 0529  WBC 6.3  --  5.9 5.9  NEUTROABS 3.9  --   --   --   HGB 12.7  < > 11.9* 12.7  HCT 38.5  < > 37.0 38.7  MCV 89.3  --  89.4 90.2  PLT 192  --  190 189  < > = values in this interval not displayed.  Basic Metabolic Panel:   Recent Labs Lab 01/22/15 1155 01/22/15 1203 01/23/15 0257  NA 136 136 140  K 3.6 3.6 3.9  CL 106 104 111  CO2 21*  --  24  GLUCOSE 100* 93 99  BUN _0 CREATININE 0.93 0.80 0.82   CALCIUM 9.0  --  8.7*    Lipid Panel:     Component Value Date/Time   CHOL 218* 01/23/2015 0257   TRIG 113 01/23/2015 0257   HDL 42 01/23/2015 0257   CHOLHDL 5.2 01/23/2015 0257   VLDL 23 01/23/2015 0257   LDLCALC 153* 01/23/2015 0257   HgbA1c:  Lab Results  Component Value Date   HGBA1C 6.1* 01/23/2015   Urine Drug Screen:     Component Value Date/Time   LABOPIA NONE DETECTED 01/23/2015 0041   COCAINSCRNUR NONE DETECTED 01/23/2015 0041   LABBENZ NONE DETECTED 01/23/2015 0041   AMPHETMU NONE DETECTED 01/23/2015 0041   THCU NONE DETECTED 01/23/2015 0041   LABBARB NONE DETECTED 01/23/2015 0041      IMAGING  Dg Chest 2 View 01/22/2015   No active cardiopulmonary disease. :56   Ct Head Wo Contrast 01/22/2015   Normal CT of the brain.   Ct Angio Head & Neck W/cm &/or Wo/cm 01/22/2015  1. No evidence of medium or large vessel intracranial arterial occlusion or significant stenosis. 2. Widely patent cervical carotid and vertebral arteries.   Mri & Mra Brain Wo Contrast 01/22/2015   1. Positive for small/subtle acute infarcts in the left occipital lobe and  left superior cerebellum. No associated hemorrhage or mass effect. 2.  Negative intracranial MRA. 3. Otherwise normal noncontrast MRI appearance of the brain.    PHYSICAL EXAM Pleasant young Caucasian lady not in distress. . Afebrile. Head is nontraumatic. Neck is supple without bruit.    Cardiac exam no murmur or gallop. Lungs are clear to auscultation. Distal pulses are well felt. Neurological Exam ;  Awake  Alert oriented x 3. Normal speech and language.eye movements full without nystagmus.fundi were not visualized. Vision acuity and fields appear normal. Hearing is normal. Palatal movements are normal. Face symmetric. Tongue midline. Normal strength, tone, reflexes and coordination. Normal sensation. Gait deferred. ASSESSMENT/PLAN Ms. Tiffany Gardner is a 44 y.o. female with history of HTN and HLD presenting with blurry  vision R eye. She did not receive IV t-PA due to resolving symptoms.   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 There is no evidence for right to left flow with saline microbubble, even with valsalva, likely due to the significant left to right jets Follow up cardiolipin abx  ESR, ANA, HIV, RPR -pending  LDL 153  HgbA1c 6.1  Lovenox 30 mg sq daily for VTE prophylaxis Diet regular Room service appropriate?: Yes; Fluid consistency:: Thin  No antithrombotic prior to admission, now on aspirin 325 mg daily  Patient counseled to be compliant with her antithrombotic medications  Ongoing aggressive stroke risk factor management   Therapy recommendations:  No therapy needs  Disposition:  Return home  Hypertension  Stable  Hyperlipidemia  Home meds:  No statin  LDL 153, goal < 70  New lipitor 20 mg added  Continue statin at discharge  Other Stroke Risk Factors  Hx 2 miscarriages  Hospital day # 1    I have personally examined this patient, reviewed notes, independently viewed imaging studies, participated in medical decision making and plan of care. I have made any additions or clarifications directly to the above note. Agree with note above. She presented with 2 tiny embolic infarcts likely cryptogenic etiology with workup is not yet completed. 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 Will discuss with cardiothoracic surgery and interventional cardiology optimal treatment  options for this.Continue aspirin and lipitor.. Discussed with Dr. Debara Pickett and Burt Knack and patient and answered questions   Antony Contras, MD Medical Director [place] Pager: [phone] 01/24/2015 1:22 PM   To contact Stroke Continuity provider, please refer to [email or web address] After hours, contact General Neurology

## 2015-01-24 NOTE — Progress Notes (Signed)
SLP Cancellation Note  Patient Details Name: Tiffany Gardner MRN: [id number] DOB: May 17, 1970   Cancelled treatment:       Reason Eval/Treat Not Completed: Patient at procedure or test/unavailable   Germain Osgood, M.A. CCC-SLP [phone]  Germain Osgood 01/24/2015, 8:51 AM

## 2015-01-25 ENCOUNTER — Encounter (HOSPITAL_COMMUNITY): Payer: Self-pay | Admitting: Internal Medicine

## 2015-01-25 ENCOUNTER — Inpatient Hospital Stay (HOSPITAL_COMMUNITY): Payer: BLUE CROSS/BLUE SHIELD

## 2015-01-25 DIAGNOSIS — Q211 Atrial septal defect: Secondary | ICD-10-CM

## 2015-01-25 DIAGNOSIS — I639 Cerebral infarction, unspecified: Secondary | ICD-10-CM

## 2015-01-25 LAB — ANTINUCLEAR ANTIBODIES, IFA: ANTINUCLEAR ANTIBODIES, IFA: NEGATIVE

## 2015-01-25 MED ORDER — ATORVASTATIN CALCIUM 20 MG PO TABS: 20.0000 mg | ORAL_TABLET | Freq: Every day | ORAL | Status: DC

## 2015-01-25 MED ORDER — ASPIRIN EC 325 MG PO TBEC
325.0000 mg | DELAYED_RELEASE_TABLET | Freq: Every day | ORAL | Status: DC
Start: 2015-01-25 — End: 2015-02-12

## 2015-01-25 NOTE — Discharge Summary (Signed)
Physician Discharge Summary  Tiffany Gardner NFA:[id number] DOB: 04-29-1970 DOA: 01/22/2015  PCP: No primary care provider on file.  Admit date: 01/22/2015 Discharge date: 01/25/2015  Time spent: Greater than 30 minutes  Recommendations for Outpatient Follow-up:  1. Dr. Antony Contras, Neurology in one month. Please follow antiphospholipid syndrome evaluation studies which were sent from the hospital. 2. Dr. Sherren Mocha, Cardiology: MDs office will arrange follow-up appointment to be seen in one week for evaluation & management of ASD.  Discharge Diagnoses:  Active Problems:   TIA (transient ischemic attack)   Hyperlipidemia   Acute embolic stroke ([place])   Acute CVA (cerebrovascular accident) [facility])   Discharge Condition: Improved & Stable  Diet recommendation: Heart healthy diet.  Filed Weights   01/22/15 1445  Weight: 87.998 kg (194 lb)    History of present illness:  44 year old female patient, PMH of HTN, HLD, chronic anemia, presented to [facility] ED on 01/22/15 with transient right-sided weakness and blurred vision in right eye. Workup confirms left occipital and cerebellar infarcts. Stroke service consulted. TEE confirmed large ASD.   Hospital Course:   Stroke - Left occipital and left cerebellar infarcts. - Etiology: Felt to be embolic of unknown source - MRI brain confirms above strokes - MRA brain: No large vessel disease - CTA head and neck: No large vessel disease. Carotid Dopplers hence canceled. - TEE 01/24/15: Confirms ASD-detailed results as below.  - Lower extremity venous Dopplers: Negative for DVT. - hypercoagulable workup: Cardiolipin antibodies, ESR (40), ANA (negative), HIV (nonreactive), RPR (nonreactive). Antiphospholipid syndrome evaluation: Pending - LDL 153 - HbA1c: 6.1 - Did not receive TPA due to rapid resolution of symptoms - Not on antithrombotic prior to admission. Now on aspirin 325 MG daily. She will be enrolled in PARFAIT trial per neurology - No  recurrence of symptoms. Deficits have resolved. - Other stroke risk factors, history of 2 miscarriages - Patient seen today along with stroke M.D. Dr. Leonie Man has cleared patient for discharge and will follow-up outpatient. She has been counseled to avoid strenuous activity or straining. She verbalizes understanding.  Essential hypertension - Controlled  Hyperlipidemia - LDL 153. Goal <70 - Not on statins PTA. Now on Lipitor 20 MG daily  Chronic anemia - States that she has always had problem with anemia. She does give history of heavy menstrual bleeding and is currently menstruating. Outpatient follow-up. - Hemoglobin normal at 12.7.  ASD - Interventional cardiology was consulted. Discussed with Dr. Sherren Mocha. He does not see a role for thoracic surgery intervention at this time. He will arrange close outpatient follow-up in 1 week for evaluation for percutaneous closure of ASD. - As discussed by neurology with patient, not sure if ASD is contributing to her current stroke but it is reasonable to close ASD.    Consultants:  Neurology  Cardiology-discussed with Dr. Burt Knack.   Procedures:  TEE 12/1: Study Conclusions  - Left ventricle: The cavity size was normal. Wall thickness was normal. Systolic function was normal. The estimated ejection fraction was in the range of 55% to 60%. Wall motion was normal; there were no regional wall motion abnormalities. - Aortic valve: No evidence of vegetation. - Mitral valve: No evidence of vegetation. - Left atrium: No evidence of thrombus in the atrial cavity or appendage. - Right ventricle: Mildly dilated. - Right atrium: The atrium was dilated. 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 There are 2 separate secundum atrial septal defects, with moderate left to right flow by color  doppler seen in 2D and 3D imaging. They are in the inferior and medial portions of the atrial septum. Right to left  flow by saline microbubble contrast was not noted, even with valsalva. - Pulmonic valve: No evidence of vegetation.  Impressions:  - Atrial septal aneurysm with secundum ASD- there are signs of right heart enlargement. Consider percutaneous or surgical closure evaluation.  Bilateral lower extremity venous Dopplers 01/25/15: Preliminary results by tech - Bilateral venous duplex Lower Ext. Completed. Negative for deep and superficial vein thrombosis in both legs.   Antibiotics:  None   Discharge Exam:  Complaints:  Denies complaints. No recurrence of strokelike symptoms since admission.  Filed Vitals:   01/24/15 2201 01/25/15 0148 01/25/15 0557 01/25/15 0938  BP: 112/65 103/56 105/46 118/61  Pulse: 70 60 72 71  Temp: 98.1 F (36.7 C) 98 F (36.7 C) 98.1 F (36.7 C) 98.2 F (36.8 C)  TempSrc: Oral Oral Oral Oral  Resp: _0 Height:      Weight:      SpO2: 99% 99% 100% 100%    General exam: Pleasant young female sitting up comfortably in bed. Patient seen along with stroke M.D. Respiratory system: Clear. No increased work of breathing. Cardiovascular system: S1 & S2 heard, RRR. No JVD, murmurs, gallops, clicks or pedal edema. Telemetry: Sinus rhythm. Gastrointestinal system: Abdomen is nondistended, soft and nontender. Normal bowel sounds heard. Central nervous system: Alert and oriented. No focal neurological deficits. Extremities: Symmetric 5 x 5 power.  Discharge Instructions      Discharge Instructions    Activity as tolerated - No restrictions    Complete by:  As directed      Call MD for:    Complete by:  As directed   Strokelike symptoms.     Diet - low sodium heart healthy    Complete by:  As directed             Medication List    TAKE these medications        aspirin EC 325 MG tablet  Take 1 tablet (325 mg total) by mouth daily.     atorvastatin 20 MG tablet  Commonly known as:  LIPITOR  Take 1 tablet (20 mg total) by mouth  daily at 6 PM.       Follow-up Information    Follow up with SETHI,PRAMOD, MD. Schedule an appointment as soon as possible for a visit in 1 month.   Specialties:  Neurology, Radiology   Why:  Call their office next week for an appointment .   Contact information:   [address] [place] [address] [phone]       Follow up with Sherren Mocha, MD.   Specialty:  Cardiology   Why:  MDs office will call you with appointment to be seen in one week.   Contact information:   [address] [place] [address] [phone]        The results of significant diagnostics from this hospitalization (including imaging, microbiology, ancillary and laboratory) are listed below for reference.    Significant Diagnostic Studies: Ct Angio Head W/cm &/or Wo Cm  01/22/2015  CLINICAL DATA:  Right-sided weakness and dizziness. Code stroke earlier today. Symptoms have now resolved. EXAM: CT ANGIOGRAPHY HEAD AND NECK TECHNIQUE: Multidetector CT imaging of the head and neck was performed using the standard protocol during bolus administration of intravenous contrast. Multiplanar CT image reconstructions and MIPs were obtained to  evaluate the vascular anatomy. Carotid stenosis measurements (when applicable) are obtained utilizing NASCET criteria, using the distal internal carotid diameter as the denominator. CONTRAST:  38m OMNIPAQUE IOHEXOL 350 MG/ML SOLN COMPARISON:  Noncontrast head CT earlier today FINDINGS: CT HEAD Brain: There is no evidence of acute infarct, intracranial hemorrhage, mass, midline shift, or extra-axial fluid collection on this contrast-enhanced CT. Ventricles and sulci are normal. Calvarium and skull base: No fracture or destructive osseous lesion. Paranasal sinuses: Clear. Minimal right maxillary sinus mucosal thickening and osteitis is suggestive of chronic sinusitis. Right maxillary molar periapical lucency as described on CT earlier today. Orbits:  Unremarkable. CTA NECK Aortic arch: 3 vessel aortic arch. Brachiocephalic and subclavian arteries are widely patent. Right carotid system: Patent without evidence of stenosis, dissection, or aneurysm. No significant atherosclerosis. Superior thyroid artery incidentally arises at the carotid bifurcation. Left carotid system: Patent without evidence of stenosis, dissection, or aneurysm. No significant atherosclerotic disease. Vertebral arteries: Widely patent and codominant. Skeleton: Mild lower cervical disc degeneration, greatest at C6-7. Other neck: Heterogeneous, asymmetric enlargement of the right thyroid lobe with discrete nodules measuring up to 11 mm in size. CTA HEAD Anterior circulation: Internal carotid arteries are patent from skullbase to carotid termini without stenosis. ACAs and MCAs are patent without evidence of sizable branch vessel occlusion or significant stenosis. No intracranial aneurysm is identified. Posterior circulation: Intracranial vertebral arteries are widely patent to the basilar. PICA and SCA origins are patent. Basilar artery is patent without stenosis. Posterior communicating arteries are not clearly identified. PCAs are patent without evidence of significant stenosis. Venous sinuses: Patent. Anatomic variants: None. Delayed phase: No abnormal enhancement. IMPRESSION: 1. No evidence of medium or large vessel intracranial arterial occlusion or significant stenosis. 2. Widely patent cervical carotid and vertebral arteries. Electronically Signed   By: ALogan BoresM.D.   On: 01/22/2015 14:20   Dg Chest 2 View  01/22/2015  CLINICAL DATA:  Chest pain. EXAM: CHEST  2 VIEW COMPARISON:  None. FINDINGS: The heart size and mediastinal contours are within normal limits. Both lungs are clear. No pneumothorax or pleural effusion is noted. The visualized skeletal structures are unremarkable. IMPRESSION: No active cardiopulmonary disease. Electronically Signed   By: JMarijo Conception M.D.   On:  01/22/2015 15:56   Ct Head Wo Contrast  01/22/2015  CLINICAL DATA:  Code stroke.  Blurred vision.  Right-sided weakness. EXAM: CT HEAD WITHOUT CONTRAST TECHNIQUE: Contiguous axial images were obtained from the base of the skull through the vertex without intravenous contrast. COMPARISON:  None. FINDINGS: Ventricle size is normal. Negative for acute or chronic infarction. Negative for hemorrhage or fluid collection. Negative for mass or edema. No shift of the midline structures. Calvarium is intact. Right upper third molar periapical lucency compatible with dental infection. IMPRESSION: Normal CT of the brain. Critical Value/emergent results were called by telephone at the time of interpretation on 01/22/2015 at 12:03 pm to Dr. NSilverio Decamp, who verbally acknowledged these results. Electronically Signed   By: CFranchot GalloM.D.   On: 01/22/2015 12:04   Ct Angio Neck W/cm &/or Wo/cm  01/22/2015  CLINICAL DATA:  Right-sided weakness and dizziness. Code stroke earlier today. Symptoms have now resolved. EXAM: CT ANGIOGRAPHY HEAD AND NECK TECHNIQUE: Multidetector CT imaging of the head and neck was performed using the standard protocol during bolus administration of intravenous contrast. Multiplanar CT image reconstructions and MIPs were obtained to evaluate the vascular anatomy. Carotid stenosis measurements (when applicable) are obtained utilizing NASCET criteria, using the distal  internal carotid diameter as the denominator. CONTRAST:  71m OMNIPAQUE IOHEXOL 350 MG/ML SOLN COMPARISON:  Noncontrast head CT earlier today FINDINGS: CT HEAD Brain: There is no evidence of acute infarct, intracranial hemorrhage, mass, midline shift, or extra-axial fluid collection on this contrast-enhanced CT. Ventricles and sulci are normal. Calvarium and skull base: No fracture or destructive osseous lesion. Paranasal sinuses: Clear. Minimal right maxillary sinus mucosal thickening and osteitis is suggestive of chronic sinusitis.  Right maxillary molar periapical lucency as described on CT earlier today. Orbits: Unremarkable. CTA NECK Aortic arch: 3 vessel aortic arch. Brachiocephalic and subclavian arteries are widely patent. Right carotid system: Patent without evidence of stenosis, dissection, or aneurysm. No significant atherosclerosis. Superior thyroid artery incidentally arises at the carotid bifurcation. Left carotid system: Patent without evidence of stenosis, dissection, or aneurysm. No significant atherosclerotic disease. Vertebral arteries: Widely patent and codominant. Skeleton: Mild lower cervical disc degeneration, greatest at C6-7. Other neck: Heterogeneous, asymmetric enlargement of the right thyroid lobe with discrete nodules measuring up to 11 mm in size. CTA HEAD Anterior circulation: Internal carotid arteries are patent from skullbase to carotid termini without stenosis. ACAs and MCAs are patent without evidence of sizable branch vessel occlusion or significant stenosis. No intracranial aneurysm is identified. Posterior circulation: Intracranial vertebral arteries are widely patent to the basilar. PICA and SCA origins are patent. Basilar artery is patent without stenosis. Posterior communicating arteries are not clearly identified. PCAs are patent without evidence of significant stenosis. Venous sinuses: Patent. Anatomic variants: None. Delayed phase: No abnormal enhancement. IMPRESSION: 1. No evidence of medium or large vessel intracranial arterial occlusion or significant stenosis. 2. Widely patent cervical carotid and vertebral arteries. Electronically Signed   By: ALogan BoresM.D.   On: 01/22/2015 14:20   Mr Brain Wo Contrast  01/22/2015  ADDENDUM REPORT: 01/22/2015 17:48 ADDENDUM: Study discussed by telephone with Dr. BNiel Hummeron 01/22/2015 at 1735 hours. Electronically Signed   By: HGenevie AnnM.D.   On: 01/22/2015 17:48  01/22/2015  CLINICAL DATA:  44year old female with acute onset right visual field  changes at 1050 hours today, associated with dizziness. Initial encounter. EXAM: MRI HEAD WITHOUT CONTRAST MRA HEAD WITHOUT CONTRAST TECHNIQUE: Multiplanar, multiecho pulse sequences of the brain and surrounding structures were obtained without intravenous contrast. Angiographic images of the head were obtained using MRA technique without contrast. COMPARISON:  CTA head and neck 1342 hours today. FINDINGS: MRI HEAD FINDINGS There are 2 small foci of restricted diffusion in the left posterior circulation. The larger is in the inferior left occipital lobe seen on series 3, image 31. There is then a subtle linear area of restricted diffusion in the left superior cerebellum (image 34). Both of these are visible on coronal diffusion imaging also. There is no associated parenchymal T2 or FLAIR hyperintensity. No associated hemorrhage or mass effect. There is subtle increased FLAIR signal in the left PCA branch visible on series 8, image 11. Major intracranial vascular flow voids are within normal limits. No other restricted diffusion. GPearline Cablesand white matter signal elsewhere is within normal limits. No chronic cerebral blood products. No midline shift, mass effect, evidence of mass lesion, ventriculomegaly, extra-axial collection or acute intracranial hemorrhage. Cervicomedullary junction and pituitary are within normal limits. Negative visualized cervical spine. Normal bone marrow signal. Visible internal auditory structures appear normal. Mastoids are clear. Mild paranasal sinus mucosal thickening. Optic chiasm and bilateral intraorbital soft tissues are normal. Negative scalp soft tissues. MRA HEAD FINDINGS Antegrade flow in the posterior circulation with  codominant distal vertebral arteries. No distal vertebral artery stenosis. Bilateral PICA flow is present. Normal vertebrobasilar junction. No basilar stenosis. SCA and PCA origins are normal. Posterior communicating arteries are diminutive or absent. Bilateral PCA  branches appear normal, including those on the left where subtle increased FLAIR signal was noted. Antegrade flow in both ICA siphons. No siphon stenosis. Normal ophthalmic artery origins. Normal carotid termini, MCA and ACA origins. Diminutive anterior communicating artery. Visualized bilateral ACA branches, and visualized bilateral MCA branches are within normal limits. IMPRESSION: 1. 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 No associated hemorrhage or mass effect. 2.  Negative intracranial MRA. 3. Otherwise normal noncontrast MRI appearance of the brain. Electronically Signed: By: Genevie Ann M.D. On: 01/22/2015 17:27   Mr Jodene Nam Head/brain Wo Cm  01/22/2015  ADDENDUM REPORT: 01/22/2015 17:48 ADDENDUM: Study discussed by telephone with Dr. Niel Hummer on 01/22/2015 at 1735 hours. Electronically Signed   By: Genevie Ann M.D.   On: 01/22/2015 17:48  01/22/2015  CLINICAL DATA:  44 year old female with acute onset right visual field changes at 1050 hours today, associated with dizziness. Initial encounter. EXAM: MRI HEAD WITHOUT CONTRAST MRA HEAD WITHOUT CONTRAST TECHNIQUE: Multiplanar, multiecho pulse sequences of the brain and surrounding structures were obtained without intravenous contrast. Angiographic images of the head were obtained using MRA technique without contrast. COMPARISON:  CTA head and neck 1342 hours today. FINDINGS: MRI HEAD FINDINGS There are 2 small foci of restricted diffusion in the left posterior circulation. The larger is in the inferior left occipital lobe seen on series 3, image 31. There is then a subtle linear area of restricted diffusion in the left superior cerebellum (image 34). Both of these are visible on coronal diffusion imaging also. There is no associated parenchymal T2 or FLAIR hyperintensity. No associated hemorrhage or mass effect. There is subtle increased FLAIR signal in the left PCA branch visible on series 8, image  11. Major intracranial vascular flow voids are within normal limits. No other restricted diffusion. Pearline Cables and white matter signal elsewhere is within normal limits. No chronic cerebral blood products. No midline shift, mass effect, evidence of mass lesion, ventriculomegaly, extra-axial collection or acute intracranial hemorrhage. Cervicomedullary junction and pituitary are within normal limits. Negative visualized cervical spine. Normal bone marrow signal. Visible internal auditory structures appear normal. Mastoids are clear. Mild paranasal sinus mucosal thickening. Optic chiasm and bilateral intraorbital soft tissues are normal. Negative scalp soft tissues. MRA HEAD FINDINGS Antegrade flow in the posterior circulation with codominant distal vertebral arteries. No distal vertebral artery stenosis. Bilateral PICA flow is present. Normal vertebrobasilar junction. No basilar stenosis. SCA and PCA origins are normal. Posterior communicating arteries are diminutive or absent. Bilateral PCA branches appear normal, including those on the left where subtle increased FLAIR signal was noted. Antegrade flow in both ICA siphons. No siphon stenosis. Normal ophthalmic artery origins. Normal carotid termini, MCA and ACA origins. Diminutive anterior communicating artery. Visualized bilateral ACA branches, and visualized bilateral MCA branches are within normal limits. IMPRESSION: 1. 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 No associated hemorrhage or mass effect. 2.  Negative intracranial MRA. 3. Otherwise normal noncontrast MRI appearance of the brain. Electronically Signed: By: Genevie Ann M.D. On: 01/22/2015 17:27    Microbiology: No results found for this or any previous visit (from the past 240 hour(s)).   Labs: Basic Metabolic Panel:  Recent Labs Lab 01/22/15 1155 01/22/15 1203  01/23/15 0257  NA 136 136 140  K 3.6 3.6 3.9  CL 106 104 111  CO2 21*  --  24   GLUCOSE 100* 93 99  BUN _0 CREATININE 0.93 0.80 0.82  CALCIUM 9.0  --  8.7*   Liver Function Tests:  Recent Labs Lab 01/22/15 1155  AST 22  ALT 18  ALKPHOS 77  BILITOT 0.8  PROT 7.1  ALBUMIN 4.0   No results for input(s): LIPASE, AMYLASE in the last 168 hours. No results for input(s): AMMONIA in the last 168 hours. CBC:  Recent Labs Lab 01/22/15 1155 01/22/15 1203 01/23/15 0257 01/24/15 0529  WBC 6.3  --  5.9 5.9  NEUTROABS 3.9  --   --   --   HGB 12.7 13.6 11.9* 12.7  HCT 38.5 40.0 37.0 38.7  MCV 89.3  --  89.4 90.2  PLT 192  --  190 189   Cardiac Enzymes: No results for input(s): CKTOTAL, CKMB, CKMBINDEX, TROPONINI in the last 168 hours. BNP: BNP (last 3 results) No results for input(s): BNP in the last 8760 hours.  ProBNP (last 3 results) No results for input(s): PROBNP in the last 8760 hours.  CBG:  Recent Labs Lab 01/22/15 1208  GLUCAP 93     Additional labs: 1. Urine drug screen: Negative 2. Blood alcohol level <5.   Signed:  Vernell Leep, MD, FACP, FHM. [facility] Pager [phone]  If 7PM-7AM, please contact night-coverage [email or web address] Password [email or web address] 01/25/2015, 2:05 PM

## 2015-01-25 NOTE — Discharge Instructions (Signed)
Stroke Prevention Some medical conditions and behaviors are associated with an increased chance of having a stroke. You may prevent a stroke by making healthy choices and managing medical conditions. HOW CAN I REDUCE MY RISK OF HAVING A STROKE?   Stay physically active. Get at least 30 minutes of activity on most or all days.  Do not smoke. It may also be helpful to avoid exposure to secondhand smoke.  Limit alcohol use. Moderate alcohol use is considered to be:  No more than 2 drinks per day for men.  No more than 1 drink per day for nonpregnant women.  Eat healthy foods. This involves:  Eating 5 or more servings of fruits and vegetables a day.  Making dietary changes that address high blood pressure (hypertension), high cholesterol, diabetes, or obesity.  Manage your cholesterol levels.  Making food choices that are high in fiber and low in saturated fat, trans fat, and cholesterol may control cholesterol levels.  Take any prescribed medicines to control cholesterol as directed by your health care provider.  Manage your diabetes.  Controlling your carbohydrate and sugar intake is recommended to manage diabetes.  Take any prescribed medicines to control diabetes as directed by your health care provider.  Control your hypertension.  Making food choices that are low in salt (sodium), saturated fat, trans fat, and cholesterol is recommended to manage hypertension.  Ask your health care provider if you need treatment to lower your blood pressure. Take any prescribed medicines to control hypertension as directed by your health care provider.  If you are 75-81 years of age, have your blood pressure checked every 3-5 years. If you are 35 years of age or older, have your blood pressure checked every year.  Maintain a healthy weight.  Reducing calorie intake and making food choices that are low in sodium, saturated fat, trans fat, and cholesterol are recommended to manage  weight.  Stop drug abuse.  Avoid taking birth control pills.  Talk to your health care provider about the risks of taking birth control pills if you are over 67 years old, smoke, get migraines, or have ever had a blood clot.  Get evaluated for sleep disorders (sleep apnea).  Talk to your health care provider about getting a sleep evaluation if you snore a lot or have excessive sleepiness.  Take medicines only as directed by your health care provider.  For some people, aspirin or blood thinners (anticoagulants) are helpful in reducing the risk of forming abnormal blood clots that can lead to stroke. If you have the irregular heart rhythm of atrial fibrillation, you should be on a blood thinner unless there is a good reason you cannot take them.  Understand all your medicine instructions.  Make sure that other conditions (such as anemia or atherosclerosis) are addressed. SEEK IMMEDIATE MEDICAL CARE IF:   You have sudden weakness or numbness of the face, arm, or leg, especially on one side of the body.  Your face or eyelid droops to one side.  You have sudden confusion.  You have trouble speaking (aphasia) or understanding.  You have sudden trouble seeing in one or both eyes.  You have sudden trouble walking.  You have dizziness.  You have a loss of balance or coordination.  You have a sudden, severe headache with no known cause.  You have new chest pain or an irregular heartbeat. Any of these symptoms may represent a serious problem that is an emergency. Do not wait to see if the symptoms will  go away. Get medical help at once. Call your local emergency services (911 in [place] Do not drive yourself to the hospital.   This information is not intended to replace advice given to you by your health care provider. Make sure you discuss any questions you have with your health care provider.   Document Released: 03/19/2004 Document Revised: 03/02/2014 Document Reviewed:  08/12/2012 Elsevier Interactive Patient Education 2016 [place].  Atrial Septal Defect, Adult An atrial septal defect (ASD) is a hole in the heart. This hole is located in the thin tissue (septum) that separates the two upper chambers of the heart, the right and left atrium. This hole is present at birth (congenital). A few minutes after birth, this hole normally closes so that blood is not able to go between the right and left atrium.  Normally, blood from the right side of the heart is pumped to the lungs where the blood is oxygenated. The oxygenated blood from the lungs is then pumped to the left side of the heart. From the left side of the heart, blood is pumped out to the rest of the body. When an ASD occurs, blood from the left atrium mixes with blood in the right atrium. The blood is then recirculated to the lungs and left side of the heart. In other words, the blood makes the trip twice. An ASD makes the heart work harder by increasing the amount of blood in the right side of the heart. This causes heart overload and eventually weakens the heart's ability to pump.  CAUSES  The cause of ASD is not known. SIGNS AND SYMPTOMS  The symptoms of ASD vary depending upon the size of the hole and the amount of blood that goes into the right atrium. There may be no symptoms or symptoms may include:  Tiredness or fatigue.  Trouble breathing or shortness of breath.  Irregular heartbeats (arrhythmias).  An extra "swishing" or "whooshing" type sound (heart murmur) heard when listening to the heart. DIAGNOSIS  In order to diagnose ASD, tests will need to be performed. Some of the tests may include:  ECG. This records the electrical activity of your heart and traces the patterns of your heartbeat.  Chest X-ray exam.  MRI or CT scan.  Nuclear medicine blood flow study. This imaging test shows how much blood is being passed through the ASD.  Echocardiography. There are two types that may be  used:  Transthoracic echocardiography (TTE). A TTE is very sensitive for detecting the two most common types of ASD, ostium primum or ostium secundum. It is not as sensitive in detecting a less common form of ASD, sinus venosus.  Transesophageal echocardiography (TEE). A TEE is especially helpful in patients who have a thin or easily movable (mobile) septum, making ASD detection more accurate.  Cardiac catheterization. In this procedure, a small tube (catheter) is passed through a large vein in your neck, groin, or arm. With this test, your health care provider can visualize your heart defect, check how well your heart is pumping, and check the function of your heart valves. TREATMENT   No treatment may be required if only a small amount of blood is moving back and forth (shunting) from the left to right atrium.  Minimally invasive closure may be done depending on the type and location of the ASD. Similar to the cardiac catheterization used to diagnose an ASD, this type of procedure is done in a cardiac catheterization lab. A catheter is inserted into a large  blood vessel. The catheter is advanced to the ASD in the heart. A patch resembling an umbrella is threaded up the catheter and placed in the ASD hole. The patch is then "opened up" to close off the hole.  Open heart surgery may be necessary if minimally invasive closure cannot be done. If the ASD is small, the hole can be closed with stitches. If the ASD is large, a patch is sewn over the defect so the hole is closed. SEEK IMMEDIATE MEDICAL CARE IF:  You experience unusual fatigue when exerting yourself.  You have chest pain at rest or with exertion.  You notice your fingertips or lips turning pale or blue. MAKE SURE YOU:   Understand these instructions.   Will watch your condition.  Will get help right away if you are not doing well or get worse.    This information is not intended to replace advice given to you by your health  care provider. Make sure you discuss any questions you have with your health care provider.   Document Released: 09/24/2003 Document Revised: 03/02/2014 Document Reviewed: 10/17/2012 Elsevier Interactive Patient Education [employer].

## 2015-01-25 NOTE — Progress Notes (Signed)
Preliminary results by tech - Bilateral venous duplex Lower Ext. Completed. Negative for deep and superficial vein thrombosis in both legs.  Oda Cogan, BS, RDMS, RVT

## 2015-01-25 NOTE — Progress Notes (Signed)
Pt is being discharged home. Discharge instructions were given to patient. Patient is awaiting family member for transport.

## 2015-01-25 NOTE — Progress Notes (Signed)
STROKE TEAM PROGRESS NOTE   HISTORY Tiffany Gardner is an 44 y.o. female who was at baseline while at work today. At 10:50 she had dropped her key in her right hand and attempted to reach down to get them. She could not see the keys in her right visual field. She then stood in one place as she felt her right side did not feel right and she was dizzy. Coworkers noted something was off and drove her to the ED. On time of arrival her symptoms resolved. She has no PMHx and takes no medications. Modified Rankin: Rankin Score=0. She was last known well 01/22/2015 at 10:50.  Patient was not administered TPA secondary to symptoms resolved. She was admitted for further evaluation and treatment.   SUBJECTIVE (INTERVAL HISTORY) No family is at the bedside.  Overall she feels her condition is rapidly improving.  Spoke to Dr Roxy Manns and Burt Knack and feel that her ASD is emanable to endovascular closure   OBJECTIVE Temp:  [98 F (36.7 C)-98.4 F (36.9 C)] 98.2 F (36.8 C) (12/02 1404) Pulse Rate:  [60-80] 68 (12/02 1404) Cardiac Rhythm:  [-] Normal sinus rhythm (12/02 0800) Resp:  [16-20] 20 (12/02 1404) BP: (103-118)/(46-65) 111/60 mmHg (12/02 1404) SpO2:  [98 %-100 %] 98 % (12/02 1404)  CBC:  Recent Labs Lab 01/22/15 1155  01/23/15 0257 01/24/15 0529  WBC 6.3  --  5.9 5.9  NEUTROABS 3.9  --   --   --   HGB 12.7  < > 11.9* 12.7  HCT 38.5  < > 37.0 38.7  MCV 89.3  --  89.4 90.2  PLT 192  --  190 189  < > = values in this interval not displayed.  Basic Metabolic Panel:   Recent Labs Lab 01/22/15 1155 01/22/15 1203 01/23/15 0257  NA 136 136 140  K 3.6 3.6 3.9  CL 106 104 111  CO2 21*  --  24  GLUCOSE 100* 93 99  BUN _0 CREATININE 0.93 0.80 0.82  CALCIUM 9.0  --  8.7*    Lipid Panel:     Component Value Date/Time   CHOL 218* 01/23/2015 0257   TRIG 113 01/23/2015 0257   HDL 42 01/23/2015 0257   CHOLHDL 5.2 01/23/2015 0257   VLDL 23 01/23/2015 0257   LDLCALC 153* 01/23/2015 0257    HgbA1c:  Lab Results  Component Value Date   HGBA1C 6.1* 01/23/2015   Urine Drug Screen:     Component Value Date/Time   LABOPIA NONE DETECTED 01/23/2015 0041   COCAINSCRNUR NONE DETECTED 01/23/2015 0041   LABBENZ NONE DETECTED 01/23/2015 0041   AMPHETMU NONE DETECTED 01/23/2015 0041   THCU NONE DETECTED 01/23/2015 0041   LABBARB NONE DETECTED 01/23/2015 0041      IMAGING  Dg Chest 2 View 01/22/2015   No active cardiopulmonary disease. :56   Ct Head Wo Contrast 01/22/2015   Normal CT of the brain.   Ct Angio Head & Neck W/cm &/or Wo/cm 01/22/2015  1. No evidence of medium or large vessel intracranial arterial occlusion or significant stenosis. 2. Widely patent cervical carotid and vertebral arteries.   Mri & Mra Brain Wo Contrast 01/22/2015   1. 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 No associated hemorrhage or mass effect. 2.  Negative intracranial MRA. 3. Otherwise normal noncontrast MRI appearance of the brain.    PHYSICAL EXAM Pleasant young Caucasian lady not in distress. . Afebrile. Head  is nontraumatic. Neck is supple without bruit.    Cardiac exam no murmur or gallop. Lungs are clear to auscultation. Distal pulses are well felt. Neurological Exam ;  Awake  Alert oriented x 3. Normal speech and language.eye movements full without nystagmus.fundi were not visualized. Vision acuity and fields appear normal. Hearing is normal. Palatal movements are normal. Face symmetric. Tongue midline. Normal strength, tone, reflexes and coordination. Normal sensation. Gait deferred. ASSESSMENT/PLAN Ms. Tiffany Gardner is a 44 y.o. female with history of HTN and HLD presenting with blurry vision R eye. She did not receive IV t-PA due to resolving symptoms.   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 There is no evidence for right to left flow with saline microbubble, even with valsalva, likely due to the significant left to right jets Follow up cardiolipin abx  , ANA, HIV, RPR -pending ESR 40 mm  LDL 153  HgbA1c 6.1  Lovenox 30 mg sq daily for VTE prophylaxis Diet regular Room service appropriate?: Yes; Fluid consistency:: Thin Diet - low sodium heart healthy  No antithrombotic prior to admission, now on aspirin 325 mg daily  Patient counseled to be compliant with her antithrombotic medications  Ongoing aggressive stroke risk factor management   Therapy recommendations:  No therapy needs  Disposition:  Return home  Hypertension  Stable  Hyperlipidemia  Home meds:  No statin  LDL 153, goal < 70  New lipitor 20 mg added  Continue statin at discharge  Other Stroke Risk Factors  Hx 2 miscarriages  Hospital day # 2    I have personally examined this patient, reviewed notes, independently viewed imaging studies, participated in medical decision making and plan of care. I have made any additions or clarifications directly to the above note. Agree with note above. She presented with 2 tiny embolic infarcts likely cryptogenic etiology with workup is not yet completed. 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  .Continue aspirin and lipitor.. Discussed with Dr.  Burt Knack and patient and answered questions.Dc home after LE venous dopplers. Follow-up as an outpatient in stroke clinic in 2 months   Antony Contras, [place] Pager: [phone] 01/25/2015 2:15 PM   To  contact Stroke Continuity provider, please refer to [email or web address] After hours, contact General Neurology

## 2015-01-26 LAB — ANTIPHOSPHOLIPID SYNDROME EVAL, BLD
Anticardiolipin IgA: <9 APL U/mL (ref 0–11)
Anticardiolipin IgG: <9 GPL U/mL (ref 0–14)
Anticardiolipin IgM: <9 MPL U/mL (ref 0–12)
DRVVT: 41.4 s (ref 0.0–44.0)
PHOSPHATYDALSERINE, IGG: 2 {GPS'U} (ref 0–11)
PHOSPHATYDALSERINE, IGM: 14 {MPS'U} (ref 0–25)
PTT Lupus Anticoagulant: 32.4 s (ref 0.0–40.6)

## 2015-01-28 ENCOUNTER — Telehealth: Payer: Self-pay | Admitting: Neurology

## 2015-01-28 NOTE — Telephone Encounter (Signed)
Pt called to schedule f/u with Dr Leonie Man in hospital after stroke 01/22/15. He requested 2 mth follow up- he is booked until 04/25/15. Please call and advise.

## 2015-01-28 NOTE — Telephone Encounter (Signed)
Rn call patient back about follow up appt. Pt stated her discharge note stated within 4 weeks. Rn stated she will call her back if any one cancel or a new appt becomes available. Pt verbalized undestanding.

## 2015-01-29 ENCOUNTER — Telehealth: Payer: Self-pay

## 2015-01-29 NOTE — Telephone Encounter (Signed)
LVM for patient that an appt was open on 02-26-15 for new patient follow up. Rn left message for patient to call back to confirm. Pt was schedule for 1100,and told to arrive at 1045am.

## 2015-01-29 NOTE — Telephone Encounter (Signed)
LVM for patient that an appt was open on 02-26-15 for new patient follow up. Rn left message for patient to call back to confirm.

## 2015-01-31 ENCOUNTER — Encounter: Payer: Self-pay | Admitting: Cardiovascular Disease

## 2015-01-31 ENCOUNTER — Ambulatory Visit (INDEPENDENT_AMBULATORY_CARE_PROVIDER_SITE_OTHER): Payer: BLUE CROSS/BLUE SHIELD | Admitting: Cardiovascular Disease

## 2015-01-31 VITALS — BP 122/80 | HR 61 | Ht 70.0 in | Wt 189.4 lb

## 2015-01-31 DIAGNOSIS — Q211 Atrial septal defect, unspecified: Secondary | ICD-10-CM

## 2015-01-31 MED ORDER — CLOPIDOGREL BISULFATE 75 MG PO TABS: 75.0000 mg | ORAL_TABLET | Freq: Every day | ORAL | Status: DC

## 2015-01-31 NOTE — Telephone Encounter (Signed)
Rn call patient back to confirm appt. Pt stated she got the vm and will be at the appt on January 3,2016 at 1045am for check in. Rn stated her appt is at 1100am. Pt verbalized she will check in at 1045am.

## 2015-01-31 NOTE — Patient Instructions (Signed)
Medication Instructions:  Your physician has recommended you make the following change in your medication:  1. Please START Plavix (Clopidogrel) 75mg  take one tablet by mouth daily on 02/07/15  Labwork: No new orders.   Testing/Procedures: Your physician has recommended ASD closure.   Follow-Up: We will arrange further follow-up after ASD closure.   Any Other Special Instructions Will Be Listed Below (If Applicable).     If you need a refill on your cardiac medications before your next appointment, please call your pharmacy.

## 2015-02-01 ENCOUNTER — Ambulatory Visit: Payer: BLUE CROSS/BLUE SHIELD | Admitting: Cardiovascular Disease

## 2015-02-01 NOTE — Progress Notes (Signed)
Cardiology Office Note Date:  02/01/2015   ID:  Tiffany Gardner, DOB Jan 05, 1971, MRN [id number]  PCP:  No PCP Per Patient  Cardiologist:  Sherren Mocha, MD    No chief complaint on file.  History of Present Illness: Tiffany Gardner is a 44 y.o. 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 The patient has been previously healthy. She has been on no long-term medications, never has been hospitalized, and has never had surgery. She presented November 30 with an acute stroke. She was in her normal state of health when she developed right-sided weakness and blurry vision involving the right eye. Standard stroke evaluation was performed. An MRI of the brain showed 2 small infarcts, likely embolic. Hypercoagulable panel was negative. There is no evidence of vascular disease. 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 She presents today for further discussion of potential treatment options.  She complains of some residual dizziness and weakness. Otherwise has no residual deficits. She specifically denies any history of chest pain, shortness of breath, leg swelling, or heart palpitations. The patient completed and athletics when she was young and had no symptoms with heavy physical exertion. She has no history of bleeding problems. She's never had DVT or pulmonary embolus.  The patient is here in the office alone today. 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   Past Medical History  Diagnosis Date  . Medical history non-contributory   . Acute CVA (cerebrovascular accident) ([place]) 01/23/2015  . Acute embolic stroke ([place])   . Hyperlipidemia 01/22/2015  . TIA (transient ischemic attack) 01/22/2015    Past Surgical History  Procedure Laterality Date  . Tee without cardioversion N/A 01/24/2015    Procedure: TRANSESOPHAGEAL ECHOCARDIOGRAM (TEE);  Surgeon: Pixie Casino, MD;  Location: [facility] ENDOSCOPY;  Service:  Cardiovascular;  Laterality: N/A;    Current Outpatient Prescriptions  Medication Sig Dispense Refill  . aspirin EC 325 MG tablet Take 1 tablet (325 mg total) by mouth daily. 30 tablet 1  . atorvastatin (LIPITOR) 20 MG tablet Take 1 tablet (20 mg total) by mouth daily at 6 PM. 30 tablet 1  . Multiple Vitamins-Minerals (MULTIVITAMIN WITH MINERALS) tablet Take 1 tablet by mouth daily.    . clopidogrel (PLAVIX) 75 MG tablet Take 1 tablet (75 mg total) by mouth daily. 30 tablet 3   No current facility-administered medications for this visit.    Allergies:   Review of patient's allergies indicates no known allergies.   Social History:  The patient  reports that she has never smoked. She has never used smokeless tobacco. She reports that she drinks about 0.6 oz of alcohol per week. She reports that she uses illicit drugs.   Family History:  The patient's negative for congenital heart disease. Negative for premature coronary artery disease.   ROS:  Please see the history of present illness.  Otherwise, review of systems is positive for weakness, decreased appetite.  All other systems are reviewed and negative.    PHYSICAL EXAM: VS:  BP 122/80 mmHg  Pulse 61  Ht 5\' 10"  (1.778 m)  Wt 189 lb 6.4 oz (85.911 kg)  BMI 27.18 kg/m2  SpO2 99%  LMP 01/22/2015 (Exact Date) , BMI Body mass index is 27.18 kg/(m^2). GEN: Well nourished, well developed, in no acute distress HEENT: normal Neck: no JVD, no masses. No carotid bruits Cardiac: RRR without murmur or gallop  Respiratory:  clear to auscultation bilaterally, normal work of breathing GI: soft, nontender, nondistended, + BS MS: no deformity or atrophy Ext: no pretibial edema, pedal pulses 2+= bilaterally Skin: warm and dry, no rash Neuro:  Strength and sensation are intact Psych: euthymic mood, full affect  EKG:  EKG is not ordered today.  Recent Labs: 01/22/2015: ALT 18 01/23/2015: BUN 8; Creatinine, Ser 0.82; Potassium  3.9; Sodium 140 01/24/2015: Hemoglobin 12.7; Platelets 189   Lipid Panel     Component Value Date/Time   CHOL 218* 01/23/2015 0257   TRIG 113 01/23/2015 0257   HDL 42 01/23/2015 0257   CHOLHDL 5.2 01/23/2015 0257   VLDL 23 01/23/2015 0257   LDLCALC 153* 01/23/2015 0257      Wt Readings from Last 3 Encounters:  01/31/15 189 lb 6.4 oz (85.911 kg)  01/22/15 194 lb (87.998 kg)     Cardiac Studies Reviewed: TEE 01/24/2015: Study Conclusions  - Left ventricle: The cavity size was normal. Wall thickness was normal. Systolic function was normal. The estimated ejection fraction was in the range of 55% to 60%. Wall motion was normal; there were no regional wall motion abnormalities. - Aortic valve: No evidence of vegetation. - Mitral valve: No evidence of vegetation. - Left atrium: No evidence of thrombus in the atrial cavity or appendage. - Right ventricle: Mildly dilated. - Right atrium: The atrium was dilated. 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 There are 2 separate secundum atrial septal defects, with moderate left to right flow by color doppler seen in 2D and 3D imaging. They are in the inferior and medial portions of the atrial septum. Right to left flow by saline microbubble contrast was not noted, even with valsalva. - Pulmonic valve: No evidence of vegetation.  Impressions:  - Atrial septal aneurysm with secundum ASD- there are signs of right heart enlargement. Consider percutaneous or surgical closure evaluation.   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 She has a large foraminal defect and probably has to separate secundum defects with left-to-right flow seen on EEG. He there is no evidence of valvular disease or LV dysfunction. There is evidence of right heart enlargement which is indicative of hemodynamically  significant left to right shunt.. We discussed potential treatment options, including transcatheter closure. I feel this is indicated in the setting of what is likely hemodynamically significant shunting as well as a recent cryptogenic stroke. I have reviewed the potential risks, benefits, and alternatives to transcatheter ASD closure. These include stroke, myocardial infarction, cardiac perforation and tamponade, emergency surgery, bleeding, infection, and arrhythmia. This also includes device embolization and late device erosion. The patient understands that serious complications occur at a low frequency of approximately 1%. She also understands that she would require 6 months of dual antiplatelet therapy with aspirin and clopidogrel following the procedure. I would anticipate that she will need at least 2 separate atrial septal occluder devices, and possibly 3 depending on findings of intracardiac echo at the time of the procedure. She is eager to proceed and will be scheduled at the next available date.   Current medicines are reviewed with the patient today.  The patient does not have concerns regarding medicines.  Labs/ tests ordered today include:  No orders of the defined types were placed in this encounter.   Disposition:   FU post-procedure  Signed, Sherren Mocha, MD  02/01/2015 5:35 PM    [facility] [place], [place], [place]  [address] Phone: [phone]; Fax: [phone]

## 2015-02-04 ENCOUNTER — Ambulatory Visit: Payer: BLUE CROSS/BLUE SHIELD | Admitting: Physician Assistant

## 2015-02-11 ENCOUNTER — Encounter (HOSPITAL_COMMUNITY)
Admission: RE | Disposition: A | Discharge status: Home / Self Care | Payer: BLUE CROSS/BLUE SHIELD | Source: Ambulatory Visit | Attending: Cardiovascular Disease

## 2015-02-11 ENCOUNTER — Ambulatory Visit (HOSPITAL_COMMUNITY)
Admission: RE | Admit: 2015-02-11 | Discharge: 2015-02-12 | Disposition: A | Discharge status: Home / Self Care | Payer: BLUE CROSS/BLUE SHIELD | Source: Ambulatory Visit | Attending: Cardiovascular Disease | Admitting: Cardiovascular Disease

## 2015-02-11 ENCOUNTER — Other Ambulatory Visit: Payer: Self-pay | Admitting: Cardiovascular Disease

## 2015-02-11 ENCOUNTER — Encounter (HOSPITAL_COMMUNITY): Payer: Self-pay | Admitting: *Deleted

## 2015-02-11 DIAGNOSIS — Z7902 Long term (current) use of antithrombotics/antiplatelets: Secondary | ICD-10-CM | POA: Diagnosis not present

## 2015-02-11 DIAGNOSIS — Z23 Encounter for immunization: Secondary | ICD-10-CM | POA: Insufficient documentation

## 2015-02-11 DIAGNOSIS — I253 Aneurysm of heart: Secondary | ICD-10-CM | POA: Diagnosis not present

## 2015-02-11 DIAGNOSIS — E785 Hyperlipidemia, unspecified: Secondary | ICD-10-CM | POA: Insufficient documentation

## 2015-02-11 DIAGNOSIS — Q2111 Secundum atrial septal defect: Secondary | ICD-10-CM

## 2015-02-11 DIAGNOSIS — Q211 Atrial septal defect: Secondary | ICD-10-CM

## 2015-02-11 DIAGNOSIS — I517 Cardiomegaly: Secondary | ICD-10-CM | POA: Insufficient documentation

## 2015-02-11 DIAGNOSIS — Z8673 Personal history of transient ischemic attack (TIA), and cerebral infarction without residual deficits: Secondary | ICD-10-CM | POA: Insufficient documentation

## 2015-02-11 DIAGNOSIS — Z7982 Long term (current) use of aspirin: Secondary | ICD-10-CM | POA: Diagnosis not present

## 2015-02-11 HISTORY — PX: CARDIAC CATHETERIZATION: SHX172

## 2015-02-11 LAB — BASIC METABOLIC PANEL
Anion gap: 9 (ref 5–15)
BUN: 18 mg/dL (ref 6–20)
CALCIUM: 9.5 mg/dL (ref 8.9–10.3)
CO2: 23 mmol/L (ref 22–32)
CREATININE: 0.87 mg/dL (ref 0.44–1.00)
Chloride: 107 mmol/L (ref 101–111)
Glucose, Bld: 99 mg/dL (ref 65–99)
Potassium: 3.8 mmol/L (ref 3.5–5.1)
SODIUM: 139 mmol/L (ref 135–145)

## 2015-02-11 LAB — CBC
HCT: 37.3 % (ref 36.0–46.0)
Hemoglobin: 12.2 g/dL (ref 12.0–15.0)
MCH: 29 pg (ref 26.0–34.0)
MCHC: 32.7 g/dL (ref 30.0–36.0)
MCV: 88.6 fL (ref 78.0–100.0)
PLATELETS: 191 10*3/uL (ref 150–400)
RBC: 4.21 MIL/uL (ref 3.87–5.11)
RDW: 12.9 % (ref 11.5–15.5)
WBC: 6.7 10*3/uL (ref 4.0–10.5)

## 2015-02-11 LAB — PROTIME-INR
INR: 0.99 (ref 0.00–1.49)
PROTHROMBIN TIME: 13.3 s (ref 11.6–15.2)

## 2015-02-11 LAB — POCT ACTIVATED CLOTTING TIME
ACTIVATED CLOTTING TIME: 229 s
ACTIVATED CLOTTING TIME: 250 s
ACTIVATED CLOTTING TIME: 188 s
Activated Clotting Time: 229 seconds
Activated Clotting Time: 229 seconds
Activated Clotting Time: 204 seconds

## 2015-02-11 LAB — PREGNANCY, URINE: Preg Test, Ur: NEGATIVE

## 2015-02-11 SURGERY — ASD/VSD CLOSURE
Anesthesia: LOCAL

## 2015-02-11 MED ORDER — LIDOCAINE HCL (PF) 1 % IJ SOLN
INTRAMUSCULAR | Status: AC
  Filled 2015-02-11: qty 30

## 2015-02-11 MED ORDER — MIDAZOLAM HCL 2 MG/2ML IJ SOLN
INTRAMUSCULAR | Status: AC
  Filled 2015-02-11: qty 2

## 2015-02-11 MED ORDER — SODIUM CHLORIDE 0.9 % IV SOLN: INTRAVENOUS | Status: DC

## 2015-02-11 MED ORDER — SODIUM CHLORIDE 0.9 % IV SOLN: 250.0000 mL | INTRAVENOUS | Status: DC | PRN

## 2015-02-11 MED ORDER — SODIUM CHLORIDE 0.9 % WEIGHT BASED INFUSION
3.0000 mL/kg/h | INTRAVENOUS | Status: DC
  Administered 2015-02-11: 3 mL/kg/h via INTRAVENOUS

## 2015-02-11 MED ORDER — FENTANYL CITRATE (PF) 100 MCG/2ML IJ SOLN
INTRAMUSCULAR | Status: AC
  Filled 2015-02-11: qty 2

## 2015-02-11 MED ORDER — HEPARIN (PORCINE) IN NACL 2-0.9 UNIT/ML-% IJ SOLN
INTRAMUSCULAR | Status: AC
  Filled 2015-02-11: qty 1500

## 2015-02-11 MED ORDER — SODIUM CHLORIDE 0.9 % IJ SOLN: 3.0000 mL | Freq: Two times a day (BID) | INTRAMUSCULAR | Status: DC

## 2015-02-11 MED ORDER — ADULT MULTIVITAMIN W/MINERALS CH
1.0000 | ORAL_TABLET | Freq: Every day | ORAL | Status: DC
  Administered 2015-02-12: 08:00:00 1 via ORAL
  Filled 2015-02-11: qty 1

## 2015-02-11 MED ORDER — ONDANSETRON HCL 4 MG/2ML IJ SOLN
4.0000 mg | Freq: Four times a day (QID) | INTRAMUSCULAR | Status: DC | PRN
  Administered 2015-02-11: 4 mg via INTRAVENOUS

## 2015-02-11 MED ORDER — ATORVASTATIN CALCIUM 20 MG PO TABS
20.0000 mg | ORAL_TABLET | Freq: Every day | ORAL | Status: DC
Start: 2015-02-12 — End: 2015-02-12

## 2015-02-11 MED ORDER — CEFAZOLIN SODIUM-DEXTROSE 2-3 GM-% IV SOLR
INTRAVENOUS | Status: AC
  Filled 2015-02-11: qty 50

## 2015-02-11 MED ORDER — ONDANSETRON HCL 4 MG/2ML IJ SOLN
INTRAMUSCULAR | Status: AC
  Filled 2015-02-11: qty 2

## 2015-02-11 MED ORDER — MIDAZOLAM HCL 2 MG/2ML IJ SOLN
INTRAMUSCULAR | Status: DC | PRN
  Administered 2015-02-11: 1 mg via INTRAVENOUS
  Administered 2015-02-11 (×3): 2 mg via INTRAVENOUS
  Administered 2015-02-11: 1 mg via INTRAVENOUS
  Administered 2015-02-11: 2 mg via INTRAVENOUS

## 2015-02-11 MED ORDER — INFLUENZA VAC SPLIT QUAD 0.5 ML IM SUSY
0.5000 mL | PREFILLED_SYRINGE | INTRAMUSCULAR | Status: AC
  Administered 2015-02-12: 0.5 mL via INTRAMUSCULAR
  Filled 2015-02-11: qty 0.5

## 2015-02-11 MED ORDER — SODIUM CHLORIDE 0.9 % IV SOLN
250.0000 mL | INTRAVENOUS | Status: DC | PRN
  Administered 2015-02-11: 250 mL via INTRAVENOUS

## 2015-02-11 MED ORDER — HEPARIN SODIUM (PORCINE) 1000 UNIT/ML IJ SOLN
INTRAMUSCULAR | Status: AC
  Filled 2015-02-11: qty 1

## 2015-02-11 MED ORDER — ACETAMINOPHEN 325 MG PO TABS: 650.0000 mg | ORAL_TABLET | ORAL | Status: DC | PRN

## 2015-02-11 MED ORDER — CLOPIDOGREL BISULFATE 75 MG PO TABS
75.0000 mg | ORAL_TABLET | Freq: Every day | ORAL | Status: DC
  Administered 2015-02-12: 08:00:00 75 mg via ORAL
  Filled 2015-02-11: qty 1

## 2015-02-11 MED ORDER — NITROGLYCERIN 1 MG/10 ML FOR IR/CATH LAB: INTRA_ARTERIAL | Status: DC | PRN

## 2015-02-11 MED ORDER — SODIUM CHLORIDE 0.9 % IJ SOLN: 3.0000 mL | INTRAMUSCULAR | Status: DC | PRN

## 2015-02-11 MED ORDER — SODIUM CHLORIDE 0.9 % WEIGHT BASED INFUSION
1.0000 mL/kg/h | INTRAVENOUS | Status: DC
  Administered 2015-02-11: 1 mL/kg/h via INTRAVENOUS

## 2015-02-11 MED ORDER — HEPARIN SODIUM (PORCINE) 1000 UNIT/ML IJ SOLN
INTRAMUSCULAR | Status: DC | PRN
  Administered 2015-02-11 (×3): 2000 [IU] via INTRAVENOUS
  Administered 2015-02-11: 6000 [IU] via INTRAVENOUS

## 2015-02-11 MED ORDER — ASPIRIN 81 MG PO CHEW
81.0000 mg | CHEWABLE_TABLET | Freq: Every day | ORAL | Status: DC
  Administered 2015-02-12: 81 mg via ORAL
  Filled 2015-02-11: qty 1

## 2015-02-11 MED ORDER — CEFAZOLIN SODIUM-DEXTROSE 2-3 GM-% IV SOLR
2.0000 g | Freq: Once | INTRAVENOUS | Status: AC
  Administered 2015-02-11: 2 g via INTRAVENOUS

## 2015-02-11 MED ORDER — FENTANYL CITRATE (PF) 100 MCG/2ML IJ SOLN
INTRAMUSCULAR | Status: DC | PRN
  Administered 2015-02-11 (×3): 50 ug via INTRAVENOUS
  Administered 2015-02-11 (×3): 25 ug via INTRAVENOUS

## 2015-02-11 MED ORDER — MULTI-VITAMIN/MINERALS PO TABS: 1.0000 | ORAL_TABLET | Freq: Every day | ORAL | Status: DC

## 2015-02-11 SURGICAL SUPPLY — 21 items
BALLN SIZING AMPLATZER 24 (BALLOONS) ×4
BALLOON SIZING AMPLATZER 24 (BALLOONS) ×2 IMPLANT
CATH ACUNAV 8FR 90CM (CATHETERS) ×2 IMPLANT
CATH EXPO 5F MPA-1 (CATHETERS) ×2 IMPLANT
CATH SITESEER 5F MULTI A 2 (CATHETERS) ×2 IMPLANT
COVER SWIFTLINK CONNECTOR (BAG) ×2 IMPLANT
GUIDEWIRE AMPLATZER 1.5JX260 (WIRE) ×4 IMPLANT
GUIDEWIRE ANGLED .035X150CM (WIRE) ×2 IMPLANT
KIT HEART LEFT (KITS) ×2 IMPLANT
OCCLUDER CRIBRIFORM 25 (Prosthesis & Implant Heart) ×1 IMPLANT
OCCLUDER CRIBRIFORM 25MM (Prosthesis & Implant Heart) ×1 IMPLANT
OCCLUDER CRIBRIFORM 30MM (Prosthesis & Implant Heart) ×2 IMPLANT
PACK CARDIAC CATHETERIZATION (CUSTOM PROCEDURE TRAY) ×2 IMPLANT
PROTECTION STATION PRESSURIZED (MISCELLANEOUS) ×2
SHEATH PINNACLE 5F 10CM (SHEATH) ×6 IMPLANT
SHEATH PINNACLE 8F 10CM (SHEATH) ×4 IMPLANT
SHEATH PINNACLE 9F 10CM (SHEATH) ×4 IMPLANT
STATION PROTECTION PRESSURIZED (MISCELLANEOUS) ×1 IMPLANT
SYSTEM DELIVERY AMPLATZER 8FR (SHEATH) ×2 IMPLANT
SYSTEM DELIVERY AMPLATZER 9FR (SHEATH) ×2 IMPLANT
TRANSDUCER W/STOPCOCK (MISCELLANEOUS) ×2 IMPLANT

## 2015-02-11 NOTE — Progress Notes (Addendum)
Site area: Right groin a 8 and 9 french venous sheath was removed  Site Prior to Removal:  Level 0  Pressure Applied For 20 MINUTES    Minutes Beginning at 0855p  Manual:   Yes.    Patient Status During Pull:  Stable.  Pt felt a little nauseated and was medicated with Zofran 4 mg IV  Post Pull Groin Site:  Level 0  Post Pull Instructions Given:  Yes.    Post Pull Pulses Present:  Yes.    Dressing Applied:  Yes.    Comments:  Zofran effective for nausea.  BP remain stable  Report called to [facility] on [phone]

## 2015-02-11 NOTE — Interval H&P Note (Signed)
History and Physical Interval Note:  02/11/2015 3:55 PM  Tiffany Gardner  has presented today for surgery, with the diagnosis of asd  The various methods of treatment have been discussed with the patient and family. After consideration of risks, benefits and other options for treatment, the patient has consented to  Procedure(s): ASD Closure (N/A) as a surgical intervention .  The patient's history has been reviewed, patient examined, no change in status, stable for surgery.  I have reviewed the patient's chart and labs.  Questions were answered to the patient's satisfaction.     Sherren Mocha

## 2015-02-11 NOTE — Progress Notes (Signed)
Site area: Left  Groin a 8 and 9 french venous sheath was removed  Site Prior to Removal:  Level 0  Pressure Applied For 20 MINUTES    Minutes Beginning at 2055 p  Manual:   Yes.    Patient Status During Pull:  Stable.  Pt felt nauseated and was medicated with Zofran 4 mg IV  Post Pull Groin Site:  Level 0  Post Pull Instructions Given:  Yes.    Post Pull Pulses Present:  Yes.    Dressing Applied:  Yes.    Comments:  VS remain stable.  Zofran effective for nausea.

## 2015-02-11 NOTE — H&P (View-Only) (Signed)
Cardiology Office Note Date:  02/01/2015   ID:  Tiffany Gardner, DOB 05/18/1970, MRN [id number]  PCP:  No PCP Per Patient  Cardiologist:  Sherren Mocha, MD    No chief complaint on file.  History of Present Illness: Tiffany Gardner is a 44 y.o. 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 The patient has been previously healthy. She has been on no long-term medications, never has been hospitalized, and has never had surgery. She presented November 30 with an acute stroke. She was in her normal state of health when she developed right-sided weakness and blurry vision involving the right eye. Standard stroke evaluation was performed. An MRI of the brain showed 2 small infarcts, likely embolic. Hypercoagulable panel was negative. There is no evidence of vascular disease. 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 She presents today for further discussion of potential treatment options.  She complains of some residual dizziness and weakness. Otherwise has no residual deficits. She specifically denies any history of chest pain, shortness of breath, leg swelling, or heart palpitations. The patient completed and athletics when she was young and had no symptoms with heavy physical exertion. She has no history of bleeding problems. She's never had DVT or pulmonary embolus.  The patient is here in the office alone today. 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   Past Medical History  Diagnosis Date  . Medical history non-contributory   . Acute CVA (cerebrovascular accident) ([place]) 01/23/2015  . 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 Hyperlipidemia 01/22/2015  . TIA (transient ischemic attack) 01/22/2015    Past Surgical History  Procedure Laterality Date  . Tee without cardioversion N/A 01/24/2015    Procedure: TRANSESOPHAGEAL ECHOCARDIOGRAM (TEE);  Surgeon: Pixie Casino, MD;  Location: [facility] ENDOSCOPY;  Service:  Cardiovascular;  Laterality: N/A;    Current Outpatient Prescriptions  Medication Sig Dispense Refill  . aspirin EC 325 MG tablet Take 1 tablet (325 mg total) by mouth daily. 30 tablet 1  . atorvastatin (LIPITOR) 20 MG tablet Take 1 tablet (20 mg total) by mouth daily at 6 PM. 30 tablet 1  . Multiple Vitamins-Minerals (MULTIVITAMIN WITH MINERALS) tablet Take 1 tablet by mouth daily.    . clopidogrel (PLAVIX) 75 MG tablet Take 1 tablet (75 mg total) by mouth daily. 30 tablet 3   No current facility-administered medications for this visit.    Allergies:   Review of patient's allergies indicates no known allergies.   Social History:  The patient  reports that she has never smoked. She has never used smokeless tobacco. She reports that she drinks about 0.6 oz of alcohol per week. She reports that she uses illicit drugs.   Family History:  The patient's negative for congenital heart disease. Negative for premature coronary artery disease.   ROS:  Please see the history of present illness.  Otherwise, review of systems is positive for weakness, decreased appetite.  All other systems are reviewed and negative.    PHYSICAL EXAM: VS:  BP 122/80 mmHg  Pulse 61  Ht 5\' 10"  (1.778 m)  Wt 189 lb 6.4 oz (85.911 kg)  BMI 27.18 kg/m2  SpO2 99%  LMP 01/22/2015 (Exact Date) , BMI Body mass index is 27.18 kg/(m^2). GEN: Well nourished, well developed, in no acute distress HEENT: normal Neck: no JVD, no masses. No carotid bruits Cardiac: RRR without murmur or gallop  Respiratory:  clear to auscultation bilaterally, normal work of breathing GI: soft, nontender, nondistended, + BS MS: no deformity or atrophy Ext: no pretibial edema, pedal pulses 2+= bilaterally Skin: warm and dry, no rash Neuro:  Strength and sensation are intact Psych: euthymic mood, full affect  EKG:  EKG is not ordered today.  Recent Labs: 01/22/2015: ALT 18 01/23/2015: BUN 8; Creatinine, Ser 0.82; Potassium  3.9; Sodium 140 01/24/2015: Hemoglobin 12.7; Platelets 189   Lipid Panel     Component Value Date/Time   CHOL 218* 01/23/2015 0257   TRIG 113 01/23/2015 0257   HDL 42 01/23/2015 0257   CHOLHDL 5.2 01/23/2015 0257   VLDL 23 01/23/2015 0257   LDLCALC 153* 01/23/2015 0257      Wt Readings from Last 3 Encounters:  01/31/15 189 lb 6.4 oz (85.911 kg)  01/22/15 194 lb (87.998 kg)     Cardiac Studies Reviewed: TEE 01/24/2015: Study Conclusions  - Left ventricle: The cavity size was normal. Wall thickness was normal. Systolic function was normal. The estimated ejection fraction was in the range of 55% to 60%. Wall motion was normal; there were no regional wall motion abnormalities. - Aortic valve: No evidence of vegetation. - Mitral valve: No evidence of vegetation. - Left atrium: No evidence of thrombus in the atrial cavity or appendage. - Right ventricle: Mildly dilated. - Right atrium: The atrium was dilated. 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 There are 2 separate secundum atrial septal defects, with moderate left to right flow by color doppler seen in 2D and 3D imaging. They are in the inferior and medial portions of the atrial septum. Right to left flow by saline microbubble contrast was not noted, even with valsalva. - Pulmonic valve: No evidence of vegetation.  Impressions:  - Atrial septal aneurysm with secundum ASD- there are signs of right heart enlargement. Consider percutaneous or surgical closure evaluation.   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 She has a large foraminal defect and probably has to separate secundum defects with left-to-right flow seen on EEG. He there is no evidence of valvular disease or LV dysfunction. There is evidence of right heart enlargement which is indicative of hemodynamically  significant left to right shunt.. We discussed potential treatment options, including transcatheter closure. I feel this is indicated in the setting of what is likely hemodynamically significant shunting as well as a recent cryptogenic stroke. I have reviewed the potential risks, benefits, and alternatives to transcatheter ASD closure. These include stroke, myocardial infarction, cardiac perforation and tamponade, emergency surgery, bleeding, infection, and arrhythmia. This also includes device embolization and late device erosion. The patient understands that serious complications occur at a low frequency of approximately 1%. She also understands that she would require 6 months of dual antiplatelet therapy with aspirin and clopidogrel following the procedure. I would anticipate that she will need at least 2 separate atrial septal occluder devices, and possibly 3 depending on findings of intracardiac echo at the time of the procedure. She is eager to proceed and will be scheduled at the next available date.   Current medicines are reviewed with the patient today.  The patient does not have concerns regarding medicines.  Labs/ tests ordered today include:  No orders of the defined types were placed in this encounter.   Disposition:   FU post-procedure  Signed, Sherren Mocha, MD  02/01/2015 5:35 PM    [facility] [place], [place], [place]  [address] Phone: [phone]; Fax: [phone]

## 2015-02-12 ENCOUNTER — Encounter (HOSPITAL_COMMUNITY): Payer: Self-pay | Admitting: Cardiovascular Disease

## 2015-02-12 ENCOUNTER — Other Ambulatory Visit: Payer: Self-pay | Admitting: Physician Assistant

## 2015-02-12 ENCOUNTER — Ambulatory Visit (HOSPITAL_COMMUNITY): Payer: BLUE CROSS/BLUE SHIELD

## 2015-02-12 ENCOUNTER — Ambulatory Visit (HOSPITAL_BASED_OUTPATIENT_CLINIC_OR_DEPARTMENT_OTHER): Payer: BLUE CROSS/BLUE SHIELD

## 2015-02-12 ENCOUNTER — Other Ambulatory Visit: Payer: Self-pay

## 2015-02-12 DIAGNOSIS — Q211 Atrial septal defect, unspecified: Secondary | ICD-10-CM

## 2015-02-12 DIAGNOSIS — Q2112 Patent foramen ovale: Secondary | ICD-10-CM

## 2015-02-12 MED ORDER — ASPIRIN 81 MG PO CHEW: 81.0000 mg | CHEWABLE_TABLET | Freq: Every day | ORAL | Status: DC

## 2015-02-12 MED FILL — Lidocaine HCl Local Preservative Free (PF) Inj 1%: INTRAMUSCULAR | Qty: 30 | Status: AC

## 2015-02-12 MED FILL — Heparin Sodium (Porcine) 2 Unit/ML in Sodium Chloride 0.9%: INTRAMUSCULAR | Qty: 1500 | Status: AC

## 2015-02-12 NOTE — Progress Notes (Signed)
    Subjective:  No CP or dyspnea. Feels well.  Objective:  Vital Signs in the last 24 hours: Temp:  [97.4 F (36.3 C)-98.5 F (36.9 C)] 98.5 F (36.9 C) (12/20 0222) Pulse Rate:  [48-100] 66 (12/20 0222) Resp:  [7-23] 16 (12/20 0222) BP: (86-130)/(38-79) 121/38 mmHg (12/20 0222) SpO2:  [98 %-100 %] 99 % (12/20 0222) Weight:  [187 lb 13.3 oz (85.2 kg)-188 lb (85.276 kg)] 187 lb 13.3 oz (85.2 kg) (12/20 0222)  Intake/Output from previous day: 12/19 0701 - 12/20 0700 In: 458.3 [P.O.:240; I.V.:218.3] Out: 650 [Urine:650]  Physical Exam: Pt is alert and oriented, NAD HEENT: normal Neck: JVP - normal Lungs: CTA bilaterally CV: RRR without murmur or gallop Abd: soft, NT, Positive BS, no hepatomegaly Ext: no C/C/E, distal pulses intact and equal, bilateral groin sites clear Skin: warm/dry no rash   Lab Results:  Recent Labs  02/11/15 1359  WBC 6.7  HGB 12.2  PLT 191    Recent Labs  02/11/15 1359  NA 139  K 3.8  CL 107  CO2 23  GLUCOSE 99  BUN 18  CREATININE 0.87   No results for input(s): TROPONINI in the last 72 hours.  Invalid input(s): CK, MB  Cardiac Studies: CXR: ASO devices in appropriate position  Tele: Sinus rhythm  Assessment/Plan:  Multifenestrated ASD s/p Transcatheter Closure: pt stable this am. Anticipate discharge after echocardiogram done. CXR with appropriate device position.   ASA and plavix x 6 months  SBE prophylaxis x 6 months  FU one month with an echo and FU visit same day   Tiffany Gardner, M.D. 02/12/2015, 8:04 AM

## 2015-02-12 NOTE — Progress Notes (Signed)
  Echocardiogram 2D Echocardiogram has been performed.  Tiffany Gardner 02/12/2015, 9:25 AM

## 2015-02-12 NOTE — Discharge Summary (Signed)
Discharge Summary   Patient ID: Tiffany Gardner,  MRN: [id number], DOB/AGE: 11/04/1970 44 y.o.  Admit date: 02/11/2015 Discharge date: 02/12/2015  Primary Care Provider: No PCP Per Patient Primary Cardiologist: Sherren Mocha, MD   Discharge Diagnoses Active Problems:   ASD (atrial septal defect), ostium secundum   ASD secundum   Cryptogenic Stroke   Allergies No Known Allergies  Consultant: None  Procedures  Conclusion    Successful Transcatheter Device Closure of a Multi-fenestrated Secundum ASD   PFO/ASD There is an ostium secundum ASD. Balloon sizing was performed. Intracardiac echo used for procedural guidance. 30 Cribriform ASO, 25 Cribriform ASO was used to close the defect. Agitated saline study was not performed post closure. There were no immediate procedural complications.  Echocardiogram 02/12/2015 - initially read by Dr. Burt Knack. Looks ok. Pending final result.   History of Present Illness Tiffany Gardner is a 44 y.o. Female with hx of secundum ASD. The patient has been previously healthy. She has been on no long-term medications, never has been hospitalized, and has never had surgery. She presented November 30 with an acute stroke. She was in her normal state of health when she developed right-sided weakness and blurry vision involving the right eye. Standard stroke evaluation was performed. An MRI of the brain showed 2 small infarcts, likely embolic. Hypercoagulable panel was negative. There is no evidence of vascular disease. 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 She was evaluated by Dr. Burt Knack 02/01/2015 for further discussion of potential treatment options.  She complained of some residual dizziness and weakness. Otherwise has no residual deficits. She specifically denies any history of chest pain, shortness of breath, leg swelling, or heart palpitations. The patient completed and athletics when she was young and  had no symptoms with heavy physical exertion. She has no history of bleeding problems. She's never had DVT or pulmonary embolus.  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  Hospital Course  The patient presented for schedule surgery. S/p successful 30 Cribriform ASO, 25Cribriform ASO was used to close the defect. She re coved well. Post op echo looks ok. CXR with appropriate device position.   She has been seen by Dr. Burt Knack today and deemed ready for discharge home. All follow-up appointments have been scheduled. Discharge medications are listed below.    ASA and plavix x 6 months  SBE prophylaxis x 6 months  FU one month with Dr. Burt Knack and FU visit same day  Discharge Vitals Blood pressure 112/45, pulse 66, temperature 97.6 F (36.4 C), temperature source Oral, resp. rate 15, height 5\' 10"  (1.778 m), weight 187 lb 13.3 oz (85.2 kg), last menstrual period 01/22/2015, SpO2 100 %.  Filed Weights   02/11/15 1329 02/11/15 2134 02/12/15 0222  Weight: 188 lb (85.276 kg) 187 lb 13.3 oz (85.2 kg) 187 lb 13.3 oz (85.2 kg)    Labs  CBC  Recent Labs  02/11/15 1359  WBC 6.7  HGB 12.2  HCT 37.3  MCV 88.6  PLT 99991111   Basic Metabolic Panel  Recent Labs  02/11/15 1359  NA 139  K 3.8  CL 107  CO2 23  GLUCOSE 99  BUN 18  CREATININE 0.87  CALCIUM 9.5    Disposition  Pt is being discharged home today in good condition.  Follow-up Plans & Appointments  Follow-up Information    Follow up with Sherren Mocha, MD On 02/12/2015.   Specialty:  Cardiology   Why:  for post hospital echo and appointment in 1 month - office will call with date and time   Contact information:   [address] [address] [phone]           Discharge Instructions    Diet - low sodium heart healthy    Complete by:  As directed      Discharge instructions    Complete by:  As directed   No driving for 48 hours. No lifting  over 5 lbs for 1 week. No sexual activity for 1 week. Marland Kitchen Keep procedure site clean & dry. If you notice increased pain, swelling, bleeding or pus, call/return!  You may shower, but no soaking baths/hot tubs/pools for 1 week.     Increase activity slowly    Complete by:  As directed            F/u Labs/Studies: None Discharge Medications    Medication List    STOP taking these medications        aspirin EC 325 MG tablet  Replaced by:  aspirin 81 MG chewable tablet      TAKE these medications        aspirin 81 MG chewable tablet  Chew 1 tablet (81 mg total) by mouth daily.     atorvastatin 20 MG tablet  Commonly known as:  LIPITOR  Take 1 tablet (20 mg total) by mouth daily at 6 PM.     clopidogrel 75 MG tablet  Commonly known as:  PLAVIX  Take 1 tablet (75 mg total) by mouth daily.     multivitamin with minerals tablet  Take 1 tablet by mouth daily.        Duration of Discharge Encounter   Greater than 30 minutes including physician time.  Signed, Chloeanne Poteet PA-C 02/12/2015, 11:42 AM

## 2015-02-16 ENCOUNTER — Encounter (HOSPITAL_COMMUNITY): Payer: Self-pay | Admitting: *Deleted

## 2015-02-16 ENCOUNTER — Emergency Department (HOSPITAL_COMMUNITY)
Admission: EM | Admit: 2015-02-16 | Discharge: 2015-02-16 | Disposition: A | Discharge status: Home / Self Care | Payer: BLUE CROSS/BLUE SHIELD | Source: Home / Self Care | Attending: Emergency Medicine | Admitting: Emergency Medicine

## 2015-02-16 DIAGNOSIS — L03114 Cellulitis of left upper limb: Secondary | ICD-10-CM

## 2015-02-16 MED ORDER — CEFDINIR 300 MG PO CAPS: 300.0000 mg | ORAL_CAPSULE | Freq: Two times a day (BID) | ORAL | Status: DC

## 2015-02-16 NOTE — Discharge Instructions (Signed)
You have an infection in your hand. Take Omnicef twice a day for the next 7 days. I have marked the area of redness. The redness may extend slightly beyond these borders over the next 24 hours while the antibiotics kick in. You should see improvement within the next 24-48 hours. If the redness is moving rapidly beyond these borders, please go to the emergency room.

## 2015-02-16 NOTE — ED Notes (Signed)
Pt  Has  Redness   And  Swelling  To  The  l  Hand       Had   An iv   Placed   In the     Hand    -      No meds   Given      iv  Placed  5  Days   Ago   Problems started    sev  Days  Ago   -     Pt     States  Only      Slight  Pain

## 2015-02-16 NOTE — ED Provider Notes (Signed)
CSN: [id number]     Arrival date & time 02/16/15  1253 History   First MD Initiated Contact with Patient 02/16/15 1306     Chief Complaint  Patient presents with  . Hand Problem   (Consider location/radiation/quality/duration/timing/severity/associated sxs/prior Treatment) HPI  She is a 44 year old woman here for evaluation of left hand problem. She states she had an IV placed in the left hand about 5 days ago for a cardiac procedure. She does not think they put any medicine in the IV. Over the last 2 days she has noticed some swelling and redness around the site of the IV. She reports minimal pain with extension of her fingers. No fevers or chills. No nausea or vomiting.  Past Medical History  Diagnosis Date  . Medical history non-contributory   . Acute CVA (cerebrovascular accident) ([place]) 01/23/2015  . 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 Hyperlipidemia 01/22/2015  . TIA (transient ischemic attack) 01/22/2015   Past Surgical History  Procedure Laterality Date  . Tee without cardioversion N/A 01/24/2015    Procedure: TRANSESOPHAGEAL ECHOCARDIOGRAM (TEE);  Surgeon: Pixie Casino, MD;  Location: [facility] ENDOSCOPY;  Service: Cardiovascular;  Laterality: N/A;  . Cardiac catheterization N/A 02/11/2015    Procedure: ASD Closure;  Surgeon: Sherren Mocha, MD;  Location: [place] CV LAB;  Service: Cardiovascular;  Laterality: N/A;   History reviewed. No pertinent family history. Social History  Substance Use Topics  . Smoking status: Never Smoker   . Smokeless tobacco: Never Used  . Alcohol Use: 0.6 oz/week    1 Glasses of wine per week   OB History    No data available     Review of Systems As in history of present illness Allergies  Review of patient's allergies indicates no known allergies.  Home Medications   Prior to Admission medications   Medication Sig Start Date End Date Taking? Authorizing Provider  aspirin 81 MG chewable tablet Chew 1 tablet (81 mg total) by mouth daily.  02/12/15   Bhavinkumar Bhagat, PA  atorvastatin (LIPITOR) 20 MG tablet Take 1 tablet (20 mg total) by mouth daily at 6 PM. 01/25/15   Modena Jansky, MD  cefdinir (OMNICEF) 300 MG capsule Take 1 capsule (300 mg total) by mouth 2 (two) times daily. 02/16/15   Melony Overly, MD  clopidogrel (PLAVIX) 75 MG tablet Take 1 tablet (75 mg total) by mouth daily. 01/31/15   Sherren Mocha, MD  Multiple Vitamins-Minerals (MULTIVITAMIN WITH MINERALS) tablet Take 1 tablet by mouth daily.    Historical Provider, MD   Meds Ordered and Administered this Visit  Medications - No data to display  BP 135/69 mmHg  Pulse 83  Temp(Src) 98.2 F (36.8 C) (Oral)  Resp 20  SpO2 100%  LMP 01/22/2015 (Exact Date) No data found.   Physical Exam  Constitutional: She is oriented to person, place, and time. She appears well-developed and well-nourished. No distress.  Cardiovascular: Normal rate.   Pulmonary/Chest: Effort normal.  Neurological: She is alert and oriented to person, place, and time.  Skin:  Left hand: There is a healing IV site. The dorsum of the hand is diffusely erythematous and swollen. It extends slightly into the index finger. No significant warmth. No palpable fluctuance. Brisk cap refill.    ED Course  Procedures (including critical care time)  Labs Review Labs Reviewed - No data to display  Imaging Review No results found.    MDM   1. Cellulitis of left hand  We'll treat with Omnicef twice a day for 7 days. I have marked the area of erythema. Discussed that the redness may extend slightly beyond these borders on antibiotics take effect. If she develops rapid spreading of the erythema or this is not improving over the next 2 days, she will go to the emergency room.    Melony Overly, MD 02/16/15 1325

## 2015-02-24 LAB — HM PAP SMEAR: HM Pap smear: NORMAL

## 2015-02-26 ENCOUNTER — Ambulatory Visit (INDEPENDENT_AMBULATORY_CARE_PROVIDER_SITE_OTHER): Payer: BLUE CROSS/BLUE SHIELD | Admitting: Neurology

## 2015-02-26 ENCOUNTER — Encounter: Payer: Self-pay | Admitting: Neurology

## 2015-02-26 VITALS — BP 111/68 | HR 71 | Ht 70.0 in | Wt 189.0 lb

## 2015-02-26 DIAGNOSIS — I639 Cerebral infarction, unspecified: Secondary | ICD-10-CM

## 2015-02-26 NOTE — Progress Notes (Signed)
[facility] [address] [place]. [place] [address] [phone]       OFFICE FOLLOW-UP NOTE  Ms. Tiffany Gardner Date of Birth:  [birth year] Medical Record Number:  [id number]   HPI: Tiffany Gardner is seen today for the first office follow-up visit following hospital admission for stroke in November 2016.Tiffany Gardner is an 45 y.o. female who was at baseline while at work  On 01/22/15 At 10:50 she had dropped her key in her right hand and attempted to reach down to get them. She could not see the keys in her right visual field. She then stood in one place as she felt her right side did not feel right and she was dizzy. Coworkers noted something was off and drove her to the ED. On time of arrival her symptoms resolved. She has no PMHx and takes no medications. Modified Rankin: Rankin Score=0. She was last known well 01/22/2015 at 10:50. Patient was not administered TPA secondary to symptoms resolved. She was admitted for further evaluation and treatment. CT angiogram of the head and neck revealed no significant large vessel extracranial or intracranial stenosis. MRI scan of the brain which I personally reviewed showed small subtle acute infarcts involving left occipital lobe and left superior cerebellum. Patient had extensive stroke workup including transthoracic echo which was normal and trans-is a visual echo showed a complex fenestrated atrial septum defect and patent foramen ovale. Lower extremity venous Dopplers were negative for DVT. Hypercoagulable panel labs were all negative. HIV , RPR, ESR and ANA were normal. Patient was started on aspirin. She saw Dr. Sherren Mocha interventional cardiologist as outpatient and hand endovascular ASD and PFO closure on 02/11/15 and procedure went well. Intraoperative TEE showed adequate closure of the defect. Agitated saline study was not performed post closure. She is presently on aspirin 81 and Plavix 75 mg and tolerating fairly well with only minor  bruising and no bleeding. Her blood pressure is well controlled. Total cholesterol was slightly elevated at 218 and LDL at 153. She is also on Lipitor 20 mg she also she is tolerating well. Hemoglobin A1c was 6.1. She has returned back to work without restrictions. She does feel a little tired but otherwise has no complaints.   ROS:   14 system review of systems is positive for  tiredness. Fatigue. Bruising and all other systems negative  PMH:  Past Medical History  Diagnosis Date  . Medical history non-contributory   . Acute CVA (cerebrovascular accident) ([place]) 01/23/2015  . 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 Hyperlipidemia 01/22/2015  . TIA (transient ischemic attack) 01/22/2015    Social History:  Social History   Social History  . Marital Status: Single    Spouse Name: N/A  . Number of Children: N/A  . Years of Education: N/A   Occupational History  . Not on file.   Social History Main Topics  . Smoking status: Never Smoker   . Smokeless tobacco: Never Used  . Alcohol Use: 0.6 oz/week    1 Glasses of wine per week     Comment: casual  . Drug Use: Yes  . Sexual Activity: Not on file   Other Topics Concern  . Not on file   Social History Narrative    Medications:   Current Outpatient Prescriptions on File Prior to Visit  Medication Sig Dispense Refill  . aspirin 81 MG chewable tablet Chew 1 tablet (81 mg total) by mouth daily. 30 tablet 11  . atorvastatin (LIPITOR) 20 MG  tablet Take 1 tablet (20 mg total) by mouth daily at 6 PM. 30 tablet 1  . clopidogrel (PLAVIX) 75 MG tablet Take 1 tablet (75 mg total) by mouth daily. 30 tablet 3  . Multiple Vitamins-Minerals (MULTIVITAMIN WITH MINERALS) tablet Take 1 tablet by mouth daily.     No current facility-administered medications on file prior to visit.    Allergies:  No Known Allergies  Physical Exam General: well developed, well nourished, seated, in no evident distress Head: head normocephalic and atraumatic.    Neck: supple with no carotid or supraclavicular bruits Cardiovascular: regular rate and rhythm, no murmurs Musculoskeletal: no deformity Skin:  no rash/petichiae Vascular:  Normal pulses all extremities Filed Vitals:   02/26/15 1103  BP: 111/68  Pulse: 71   Neurologic Exam Mental Status: Awake and fully alert. Oriented to place and time. Recent and remote memory intact. Attention span, concentration and fund of knowledge appropriate. Mood and affect appropriate.  Cranial Nerves: Fundoscopic exam reveals sharp disc margins. Pupils equal, briskly reactive to light. Extraocular movements full without nystagmus. Visual fields full to confrontation. Hearing intact. Facial sensation intact. Face, tongue, palate moves normally and symmetrically.  Motor: Normal bulk and tone. Normal strength in all tested extremity muscles. Sensory.: intact to touch ,pinprick .position and vibratory sensation.  Coordination: Rapid alternating movements normal in all extremities. Finger-to-nose and heel-to-shin performed accurately bilaterally. Gait and Station: Arises from chair without difficulty. Stance is normal. Gait demonstrates normal stride length and balance . Able to heel, toe and tandem walk without difficulty.  Reflexes: 1+ and symmetric. Toes downgoing.   NIHSS 0 Modified Rankin 0.    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    PLAN:  had a long d/w patient about her recent stroke, PFO/ASD  risk for recurrent stroke/TIAs, personally independently reviewed imaging studies and stroke evaluation results and answered questions.Continue aspirin 81 mg daily and clopidogrel 75 mg daily  for secondary stroke prevention and maintain strict control of hypertension with blood pressure goal below 130/90, diabetes with hemoglobin A1c goal below  6.5% and lipids with LDL cholesterol goal below 70 mg/dL. I also advised the patient to eat a healthy diet with plenty of whole grains, cereals, fruits and vegetables, exercise regularly and maintain ideal body weight Followup in the future with me in  6 months or call earlier if necessary Greater than 50% of time during this 25 minute visit was spent on counseling,explanation of diagnosis, planning of further management, discussion with patient and family and coordination of care Antony Contras, MD Note: This document was prepared with digital dictation and possible smart phrase technology. Any transcriptional errors that result from this process are unintentional

## 2015-02-26 NOTE — Patient Instructions (Signed)
I had a long d/w patient about her recent stroke, PFO/ASD  risk for recurrent stroke/TIAs, personally independently reviewed imaging studies and stroke evaluation results and answered questions.Continue aspirin 81 mg daily and clopidogrel 75 mg daily  for secondary stroke prevention and maintain strict control of hypertension with blood pressure goal below 130/90, diabetes with hemoglobin A1c goal below 6.5% and lipids with LDL cholesterol goal below 70 mg/dL. I also advised the patient to eat a healthy diet with plenty of whole grains, cereals, fruits and vegetables, exercise regularly and maintain ideal body weight Followup in the future with me in  6 months or call earlier if necessary Stroke Prevention Some medical conditions and behaviors are associated with an increased chance of having a stroke. You may prevent a stroke by making healthy choices and managing medical conditions. HOW CAN I REDUCE MY RISK OF HAVING A STROKE?   Stay physically active. Get at least 30 minutes of activity on most or all days.  Do not smoke. It may also be helpful to avoid exposure to secondhand smoke.  Limit alcohol use. Moderate alcohol use is considered to be:  No more than 2 drinks per day for men.  No more than 1 drink per day for nonpregnant women.  Eat healthy foods. This involves:  Eating 5 or more servings of fruits and vegetables a day.  Making dietary changes that address high blood pressure (hypertension), high cholesterol, diabetes, or obesity.  Manage your cholesterol levels.  Making food choices that are high in fiber and low in saturated fat, trans fat, and cholesterol may control cholesterol levels.  Take any prescribed medicines to control cholesterol as directed by your health care provider.  Manage your diabetes.  Controlling your carbohydrate and sugar intake is recommended to manage diabetes.  Take any prescribed medicines to control diabetes as directed by your health care  provider.  Control your hypertension.  Making food choices that are low in salt (sodium), saturated fat, trans fat, and cholesterol is recommended to manage hypertension.  Ask your health care provider if you need treatment to lower your blood pressure. Take any prescribed medicines to control hypertension as directed by your health care provider.  If you are 32-8 years of age, have your blood pressure checked every 3-5 years. If you are 76 years of age or older, have your blood pressure checked every year.  Maintain a healthy weight.  Reducing calorie intake and making food choices that are low in sodium, saturated fat, trans fat, and cholesterol are recommended to manage weight.  Stop drug abuse.  Avoid taking birth control pills.  Talk to your health care provider about the risks of taking birth control pills if you are over 62 years old, smoke, get migraines, or have ever had a blood clot.  Get evaluated for sleep disorders (sleep apnea).  Talk to your health care provider about getting a sleep evaluation if you snore a lot or have excessive sleepiness.  Take medicines only as directed by your health care provider.  For some people, aspirin or blood thinners (anticoagulants) are helpful in reducing the risk of forming abnormal blood clots that can lead to stroke. If you have the irregular heart rhythm of atrial fibrillation, you should be on a blood thinner unless there is a good reason you cannot take them.  Understand all your medicine instructions.  Make sure that other conditions (such as anemia or atherosclerosis) are addressed. SEEK IMMEDIATE MEDICAL CARE IF:   You have sudden  weakness or numbness of the face, arm, or leg, especially on one side of the body.  Your face or eyelid droops to one side.  You have sudden confusion.  You have trouble speaking (aphasia) or understanding.  You have sudden trouble seeing in one or both eyes.  You have sudden trouble  walking.  You have dizziness.  You have a loss of balance or coordination.  You have a sudden, severe headache with no known cause.  You have new chest pain or an irregular heartbeat. Any of these symptoms may represent a serious problem that is an emergency. Do not wait to see if the symptoms will go away. Get medical help at once. Call your local emergency services (911 in [place] Do not drive yourself to the hospital.   This information is not intended to replace advice given to you by your health care provider. Make sure you discuss any questions you have with your health care provider.   Document Released: 03/19/2004 Document Revised: 03/02/2014 Document Reviewed: 08/12/2012 Elsevier Interactive Patient Education [employer].

## 2015-03-07 ENCOUNTER — Ambulatory Visit (INDEPENDENT_AMBULATORY_CARE_PROVIDER_SITE_OTHER): Payer: BLUE CROSS/BLUE SHIELD | Admitting: Cardiovascular Disease

## 2015-03-07 ENCOUNTER — Encounter: Payer: Self-pay | Admitting: Cardiovascular Disease

## 2015-03-07 ENCOUNTER — Ambulatory Visit (HOSPITAL_COMMUNITY): Payer: BLUE CROSS/BLUE SHIELD | Attending: Cardiology

## 2015-03-07 ENCOUNTER — Other Ambulatory Visit: Payer: Self-pay

## 2015-03-07 VITALS — BP 110/70 | HR 70 | Ht 70.0 in | Wt 185.1 lb

## 2015-03-07 DIAGNOSIS — Q211 Atrial septal defect: Secondary | ICD-10-CM | POA: Insufficient documentation

## 2015-03-07 DIAGNOSIS — I517 Cardiomegaly: Secondary | ICD-10-CM | POA: Diagnosis not present

## 2015-03-07 DIAGNOSIS — Q2112 Patent foramen ovale: Secondary | ICD-10-CM

## 2015-03-07 NOTE — Patient Instructions (Addendum)
Medication Instructions:  Your physician recommends that you continue on your current medications as directed. Please refer to the Current Medication list given to you today.  Labwork: No new orders.   Testing/Procedures: Your physician has requested that you have an echocardiogram (Limited with bubble study) in 6 MONTHS. Echocardiography is a painless test that uses sound waves to create images of your heart. It provides your doctor with information about the size and shape of your heart and how well your heart's chambers and valves are working. This procedure takes approximately one hour. There are no restrictions for this procedure.  Follow-Up: Your physician wants you to follow-up in: 6 MONTHS with Dr Burt Knack.  You will receive a reminder letter in the mail two months in advance. If you don't receive a letter, please call our office to schedule the follow-up appointment.   Any Other Special Instructions Will Be Listed Below (If Applicable).  Your physician discussed the importance of taking an antibiotic prior to any dental, gastrointestinal, genitourinary procedures to prevent damage to the heart valves from infection. (Amoxicillin 500mg  tablets-- take 4 tablets one hour prior to dental appointment, only required the first 6 months after PFO closure).  The pt will contact the office if she needs a prescription.          If you need a refill on your cardiac medications before your next appointment, please call your pharmacy.

## 2015-03-07 NOTE — Progress Notes (Signed)
Cardiology Office Note Date:  03/07/2015   ID:  Tiffany Gardner, DOB December 24, 1970, MRN [id number]  PCP:  Nance Pear., NP  Cardiologist:  Sherren Mocha, MD    Chief Complaint  Patient presents with  . Follow-up    Patient denies any chest pain, shortness of breath, or le edema     History of Present Illness: Tiffany Gardner is a 45 y.o. female who presents for follow-up after undergoing transcatheter ASD closure 02/11/2015.  The patient has been previously healthy. She presented 01/23/2015 with an acute stroke. An MRI of the brain showed findings suggestive of embolic infarcts. Hypercoagulable panel was negative. She had no evidence of vascular disease. The TEE demonstrated a multi-fenestrated secundum ASD and large PFO. She underwent transcatheter closure December 19 with to cribriform ASO devices. She tolerated the procedure well with no complications. She is currently doing well and has no specific complaints. She specifically denies chest pain, chest pressure, shortness of breath, or leg swelling. She does complain of fatigue.   Past Medical History  Diagnosis Date  . Medical history non-contributory   . Acute CVA (cerebrovascular accident) ([place]) 01/23/2015  . 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 Hyperlipidemia 01/22/2015  . TIA (transient ischemic attack) 01/22/2015    Past Surgical History  Procedure Laterality Date  . Tee without cardioversion N/A 01/24/2015    Procedure: TRANSESOPHAGEAL ECHOCARDIOGRAM (TEE);  Surgeon: Pixie Casino, MD;  Location: [facility] ENDOSCOPY;  Service: Cardiovascular;  Laterality: N/A;  . Cardiac catheterization N/A 02/11/2015    Procedure: ASD Closure;  Surgeon: Sherren Mocha, MD;  Location: [facility] LAB;  Service: Cardiovascular;  Laterality: N/A;    Current Outpatient Prescriptions  Medication Sig Dispense Refill  . aspirin 81 MG chewable tablet Chew 1 tablet (81 mg total) by mouth daily. 30 tablet 11  . atorvastatin (LIPITOR) 20 MG tablet Take 1  tablet (20 mg total) by mouth daily at 6 PM. 30 tablet 1  . clopidogrel (PLAVIX) 75 MG tablet Take 1 tablet (75 mg total) by mouth daily. 30 tablet 3  . Multiple Vitamins-Minerals (MULTIVITAMIN WITH MINERALS) tablet Take 1 tablet by mouth daily.    Marland Kitchen amoxicillin (AMOXIL) 500 MG tablet Take 4 tablets by mouth one hour prior to dental appointment 8 tablet 1   No current facility-administered medications for this visit.    Allergies:   Review of patient's allergies indicates no known allergies.   Social History:  The patient  reports that she has never smoked. She has never used smokeless tobacco. She reports that she drinks about 0.6 oz of alcohol per week. She reports that she uses illicit drugs.   Family History:  The patient's family history is not on file.    ROS:  Please see the history of present illness.  Otherwise, review of systems is positive for easy bruising, excessive sweating, excessive fatigue.  All other systems are reviewed and negative.    PHYSICAL EXAM: VS:  BP 110/70 mmHg  Pulse 70  Ht 5\' 10"  (1.778 m)  Wt 185 lb 1.9 oz (83.97 kg)  BMI 26.56 kg/m2  LMP 01/27/2015 , BMI Body mass index is 26.56 kg/(m^2). GEN: Well nourished, well developed, in no acute distress HEENT: normal Neck: no JVD, no masses. No carotid bruits Cardiac: RRR without murmur or gallop                Respiratory:  clear to auscultation bilaterally, normal work of breathing GI: soft, nontender, nondistended, + BS MS: no  deformity or atrophy Ext: no pretibial edema, pedal pulses 2+= bilaterally Skin: warm and dry, no rash Neuro:  Strength and sensation are intact Psych: euthymic mood, full affect  EKG:  EKG is not ordered today.  Recent Labs: 01/22/2015: ALT 18 02/11/2015: BUN 18; Creatinine, Ser 0.87; Hemoglobin 12.2; Platelets 191; Potassium 3.8; Sodium 139   Lipid Panel     Component Value Date/Time   CHOL 218* 01/23/2015 0257   TRIG 113 01/23/2015 0257   HDL 42 01/23/2015 0257    CHOLHDL 5.2 01/23/2015 0257   VLDL 23 01/23/2015 0257   LDLCALC 153* 01/23/2015 0257      Wt Readings from Last 3 Encounters:  03/07/15 185 lb 1.9 oz (83.97 kg)  02/26/15 189 lb (85.73 kg)  02/12/15 187 lb 13.3 oz (85.2 kg)     Cardiac Studies Reviewed:  2-D echo performed today is personally reviewed. The formal interpretation is pending. By my review this demonstrates normal LV size and function. The ASO devices are in appropriate position. There is no pericardial effusion. The RV appears mildly dilated.  ASSESSMENT AND PLAN: Multi-fenestrated ASD, now status post transcatheter closure. The patient is stable and her echocardiogram is reviewed. She understands the need to follow SBE prophylaxis for 6 months. She will remain on dual antiplatelet therapy with aspirin and Plavix. I will see her back for six-month follow-up with a limited echocardiogram and bubble study once her device is fully endothelialized.  Current medicines are reviewed with the patient today.  The patient does not have concerns regarding medicines.  Labs/ tests ordered today include:  Orders Placed This Encounter  Procedures  . ECHO BUBBLE STUDY   Disposition:   FU 6 months with a limited echo   Deatra James, MD  03/07/2015 4:49 PM    [address], [place], [place]  [address] Phone: [phone]; Fax: [phone]

## 2015-03-18 ENCOUNTER — Ambulatory Visit: Payer: BLUE CROSS/BLUE SHIELD | Admitting: Family

## 2015-03-19 ENCOUNTER — Other Ambulatory Visit: Payer: Self-pay | Admitting: Cardiovascular Disease

## 2015-03-19 NOTE — Telephone Encounter (Signed)
Okay to refill? 

## 2015-04-16 ENCOUNTER — Other Ambulatory Visit: Payer: Self-pay | Admitting: *Deleted

## 2015-04-16 DIAGNOSIS — Q2112 Patent foramen ovale: Secondary | ICD-10-CM

## 2015-04-16 DIAGNOSIS — Q211 Atrial septal defect: Secondary | ICD-10-CM

## 2015-04-16 MED ORDER — AMOXICILLIN 500 MG PO TABS
ORAL_TABLET | ORAL | Status: DC
Start: 2015-04-16 — End: 2015-04-30

## 2015-04-16 NOTE — Telephone Encounter (Signed)
Rx sent to the pharmacy.

## 2015-04-16 NOTE — Telephone Encounter (Signed)
Patient states that she has a dental appointment on Thursday.

## 2015-04-30 ENCOUNTER — Encounter: Payer: Self-pay | Admitting: Family

## 2015-04-30 ENCOUNTER — Ambulatory Visit (INDEPENDENT_AMBULATORY_CARE_PROVIDER_SITE_OTHER): Payer: BLUE CROSS/BLUE SHIELD | Admitting: Family

## 2015-04-30 VITALS — BP 122/66 | HR 71 | Temp 97.9°F | Ht 70.0 in | Wt 186.5 lb

## 2015-04-30 DIAGNOSIS — Q2111 Secundum atrial septal defect: Secondary | ICD-10-CM

## 2015-04-30 DIAGNOSIS — E785 Hyperlipidemia, unspecified: Secondary | ICD-10-CM | POA: Diagnosis not present

## 2015-04-30 DIAGNOSIS — R5383 Other fatigue: Secondary | ICD-10-CM | POA: Diagnosis not present

## 2015-04-30 DIAGNOSIS — R739 Hyperglycemia, unspecified: Secondary | ICD-10-CM

## 2015-04-30 DIAGNOSIS — I639 Cerebral infarction, unspecified: Secondary | ICD-10-CM

## 2015-04-30 DIAGNOSIS — Q211 Atrial septal defect: Secondary | ICD-10-CM

## 2015-04-30 LAB — TSH: TSH: 1.98 u[IU]/mL (ref 0.35–4.50)

## 2015-04-30 NOTE — Progress Notes (Signed)
Pre visit review using our clinic review tool, if applicable. No additional management support is needed unless otherwise documented below in the visit note. 

## 2015-04-30 NOTE — Progress Notes (Signed)
Subjective:    Patient ID: Tiffany Gardner, female    DOB: 1970/11/04, 44 y.o.   MRN: [id number]  HPI  Tiffany Gardner is a 45 yr old female who presents today to establish care.    Pmhx is significant for the following:  Hx of CVA/TIA- pt presented 12/3014 with an acute stroke, mri suggestive of embolic infarcts.  TEE noted ASD and large PFO.  Notes that she has no residual effects from the stroke except that she is more fatigued than she was prior to the stroke. (hospital records are reviewed)  Hyperlipidemia-  Lab Results  Component Value Date   CHOL 218* 01/23/2015   HDL 42 01/23/2015   LDLCALC 153* 01/23/2015   TRIG 113 01/23/2015   CHOLHDL 5.2 01/23/2015   Hx ASD- s/p percutaneous closure 02/11/15. She reports that she did not have any cardiac symptoms pre-operatively. 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    Hyperglycemia- Pt reports that she avoids white carbs.  Lab Results  Component Value Date   HGBA1C 6.1* 01/23/2015     Review of Systems  Constitutional: Negative for unexpected weight change.  HENT: Negative for rhinorrhea.   Respiratory: Negative for cough and shortness of breath.   Cardiovascular: Negative for chest pain.  Gastrointestinal: Negative for diarrhea and constipation.  Genitourinary: Negative for dysuria and frequency.  Musculoskeletal: Negative for myalgias and arthralgias.  Skin: Negative for rash.  Neurological: Negative for headaches.  Hematological: Negative for adenopathy.  Psychiatric/Behavioral:       Denies depression/anxiety       Past Medical History  Diagnosis Date  . Acute CVA (cerebrovascular accident) ([place]) 01/23/2015  . 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 Hyperlipidemia 01/22/2015  . TIA (transient ischemic attack) 01/22/2015    Social History   Social History  . Marital Status: Single    Spouse Name: N/A  . Number of Children: N/A  . Years of Education: N/A   Occupational History  .  Not on file.   Social History Main Topics  . Smoking status: Never Smoker   . Smokeless tobacco: Never Used  . Alcohol Use: 0.6 oz/week    1 Glasses of wine per week     Comment: casual  . Drug Use: Yes  . Sexual Activity: Not on file   Other Topics Concern  . 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   Completed college   Grew up in [place]- moved her in her mid 20's       Past Surgical History  Procedure Laterality Date  . Tee without cardioversion N/A 01/24/2015    Procedure: TRANSESOPHAGEAL ECHOCARDIOGRAM (TEE);  Surgeon: Pixie Casino, MD;  Location: [facility] ENDOSCOPY;  Service: Cardiovascular;  Laterality: N/A;  . Cardiac catheterization N/A 02/11/2015    Procedure: ASD Closure;  Surgeon: Sherren Mocha, MD;  Location: [facility] CV LAB;  Service: Cardiovascular;  Laterality: N/A;    Family History  Problem Relation Age of Onset  . Cancer Father 50    throat CA/smoker    No Known Allergies  Current Outpatient Prescriptions on File Prior to Visit  Medication Sig Dispense Refill  . aspirin 81 MG chewable tablet Chew 1 tablet (81 mg total) by mouth daily. 30 tablet 11  . atorvastatin (LIPITOR) 20 MG  tablet TAKE 1 TABLET BY MOUTH DAILY AT 6 PM 90 tablet 2  . clopidogrel (PLAVIX) 75 MG tablet Take 1 tablet (75 mg total) by mouth daily. 30 tablet 3  . Multiple Vitamins-Minerals (MULTIVITAMIN WITH MINERALS) tablet Take 1 tablet by mouth daily.     No current facility-administered medications on file prior to visit.    BP 122/66 mmHg  Pulse 71  Temp(Src) 97.9 F (36.6 C) (Oral)  Ht 5\' 10"  (1.778 m)  Wt 186 lb 8 oz (84.596 kg)  BMI 26.76 kg/m2  SpO2 97%    Objective:   Physical Exam  Constitutional: She is oriented to person, place, and time. She appears well-developed and well-nourished.  HENT:  Head: Normocephalic and  atraumatic.  Eyes: EOM are normal. Pupils are equal, round, and reactive to light. No scleral icterus.  Neck: Neck supple. No thyromegaly present.  Cardiovascular: Normal rate, regular rhythm and normal heart sounds.   No murmur heard. Pulmonary/Chest: Effort normal and breath sounds normal. No respiratory distress. She has no wheezes.  Musculoskeletal: She exhibits no edema.  Normal muscle tone  Lymphadenopathy:    She has no cervical adenopathy.  Neurological: She is alert and oriented to person, place, and time. She exhibits normal muscle tone.  Skin: Skin is warm and dry.  Psychiatric: She has a normal mood and affect. Her behavior is normal. Judgment and thought content normal.          Assessment & Plan:

## 2015-04-30 NOTE — Assessment & Plan Note (Signed)
No residual effects other than fatigue (will check TSH). Continue ASA/Plavix, statin, followed by Dr. Leonie Man, neurology.

## 2015-04-30 NOTE — Assessment & Plan Note (Signed)
On statin. Plan to recheck lipid panel at her upcoming physical.

## 2015-04-30 NOTE — Assessment & Plan Note (Signed)
We discussed diabetic diet and importance of continuing regular exercise.

## 2015-04-30 NOTE — Patient Instructions (Addendum)
Please complete lab work prior to leaving.  Welcome to [facility]! 

## 2015-04-30 NOTE — Assessment & Plan Note (Signed)
S/p percutaneous closure 02/11/15.  Clinically stable. Followed by cardiology (Dr. Burt Knack)

## 2015-05-17 ENCOUNTER — Other Ambulatory Visit: Payer: Self-pay | Admitting: Cardiovascular Disease

## 2015-05-22 ENCOUNTER — Telehealth: Payer: Self-pay | Admitting: Family

## 2015-08-06 ENCOUNTER — Encounter: Payer: BLUE CROSS/BLUE SHIELD | Admitting: Family

## 2015-08-13 ENCOUNTER — Encounter: Payer: Self-pay | Admitting: Family

## 2015-08-13 ENCOUNTER — Ambulatory Visit (INDEPENDENT_AMBULATORY_CARE_PROVIDER_SITE_OTHER): Payer: BLUE CROSS/BLUE SHIELD | Admitting: Family

## 2015-08-13 VITALS — HR 67 | Temp 98.1°F | Resp 16 | Ht 70.0 in | Wt 179.0 lb

## 2015-08-13 DIAGNOSIS — Z0000 Encounter for general adult medical examination without abnormal findings: Secondary | ICD-10-CM

## 2015-08-13 DIAGNOSIS — R739 Hyperglycemia, unspecified: Secondary | ICD-10-CM | POA: Diagnosis not present

## 2015-08-13 DIAGNOSIS — E785 Hyperlipidemia, unspecified: Secondary | ICD-10-CM | POA: Diagnosis not present

## 2015-08-13 LAB — LIPID PANEL
CHOL/HDL RATIO: 4
Cholesterol: 174 mg/dL (ref 0–200)
HDL: 42.8 mg/dL (ref >39.00)
LDL CALC: 103 mg/dL — AB (ref 0–99)
NonHDL: 130.75
TRIGLYCERIDES: 138 mg/dL (ref 0.0–149.0)
VLDL: 27.6 mg/dL (ref 0.0–40.0)

## 2015-08-13 LAB — HEPATIC FUNCTION PANEL
ALT: 23 U/L (ref 0–35)
AST: 19 U/L (ref 0–37)
Albumin: 4.2 g/dL (ref 3.5–5.2)
Alkaline Phosphatase: 74 U/L (ref 39–117)
BILIRUBIN DIRECT: 0.1 mg/dL (ref 0.0–0.3)
BILIRUBIN TOTAL: 0.6 mg/dL (ref 0.2–1.2)
Total Protein: 7.3 g/dL (ref 6.0–8.3)

## 2015-08-13 LAB — BASIC METABOLIC PANEL
BUN: 19 mg/dL (ref 6–23)
CO2: 28 mEq/L (ref 19–32)
CREATININE: 0.87 mg/dL (ref 0.40–1.20)
Calcium: 9.6 mg/dL (ref 8.4–10.5)
Chloride: 105 mEq/L (ref 96–112)
GFR: 74.76 mL/min (ref >60.00)
Glucose, Bld: 100 mg/dL — ABNORMAL HIGH (ref 70–99)
POTASSIUM: 3.7 meq/L (ref 3.5–5.1)
Sodium: 139 mEq/L (ref 135–145)

## 2015-08-13 LAB — CBC WITH DIFFERENTIAL/PLATELET
Basophils Absolute: 0 10*3/uL (ref 0.0–0.1)
Basophils Relative: 0.5 % (ref 0.0–3.0)
EOS ABS: 0 10*3/uL (ref 0.0–0.7)
Eosinophils Relative: 0.7 % (ref 0.0–5.0)
HCT: 38.7 % (ref 36.0–46.0)
HEMOGLOBIN: 13.4 g/dL (ref 12.0–15.0)
Lymphocytes Relative: 23.6 % (ref 12.0–46.0)
Lymphs Abs: 1.4 10*3/uL (ref 0.7–4.0)
MCHC: 34.7 g/dL (ref 30.0–36.0)
MCV: 87.3 fl (ref 78.0–100.0)
MONO ABS: 0.4 10*3/uL (ref 0.1–1.0)
Monocytes Relative: 6.7 % (ref 3.0–12.0)
Neutro Abs: 4 10*3/uL (ref 1.4–7.7)
Neutrophils Relative %: 68.5 % (ref 43.0–77.0)
Platelets: 156 10*3/uL (ref 150.0–400.0)
RBC: 4.44 Mil/uL (ref 3.87–5.11)
RDW: 12.3 % (ref 11.5–15.5)
WBC: 5.8 10*3/uL (ref 4.0–10.5)

## 2015-08-13 LAB — URINALYSIS, ROUTINE W REFLEX MICROSCOPIC
Bilirubin Urine: NEGATIVE
HGB URINE DIPSTICK: NEGATIVE
Ketones, ur: NEGATIVE
Nitrite: NEGATIVE
RBC / HPF: NONE SEEN (ref 0–?)
Specific Gravity, Urine: >=1.03 — AB (ref 1.000–1.030)
TOTAL PROTEIN, URINE-UPE24: NEGATIVE
Urine Glucose: NEGATIVE
Urobilinogen, UA: 0.2 (ref 0.0–1.0)
pH: 5 (ref 5.0–8.0)

## 2015-08-13 LAB — TSH: TSH: 2.74 u[IU]/mL (ref 0.35–4.50)

## 2015-08-13 NOTE — Progress Notes (Signed)
Pre visit review using our clinic review tool, if applicable. No additional management support is needed unless otherwise documented below in the visit note. 

## 2015-08-13 NOTE — Patient Instructions (Addendum)
Please complete lab work prior to leaving. You will be contacted about scheduling your mammogram at the [facility].  Let me know if you have not heard from them in 1 week.  Keep up the great work with healthy diet and exercise. You will be due for mammogram around 1/18.  Let me know if you would like a GYN referral at that time.  Follow up in 1 year for annual physical, sooner if problems/concerns.

## 2015-08-13 NOTE — Assessment & Plan Note (Addendum)
Continue healthy diet, exercise. Immunizations reviewed and up to date. Refer for mammogram, obtain routine lab work.

## 2015-08-13 NOTE — Progress Notes (Signed)
Subjective:    Patient ID: Tiffany Gardner, female    DOB: [birth year], 45 y.o.   MRN: [id number]  HPI  Tiffany Gardner is a 45 yr old female who presents today for complete physical.  Patient presents today for complete physical.  Immunizations: tetanus 2014 Diet: healthy- has improved diet Exercise: good, 1 hour walk every day, active, gym several days a week.  Pap Smear: 1/15- done in [place], normal per patient Mammogram: due Dental: up to date Vision: last summer  Wt Readings from Last 3 Encounters:  08/13/15 179 lb (81.194 kg)  04/30/15 186 lb 8 oz (84.596 kg)  03/07/15 185 lb 1.9 oz (83.97 kg)     Review of Systems  Constitutional: Positive for fatigue. Negative for unexpected weight change.  HENT: Negative for hearing loss and rhinorrhea.   Eyes: Negative for visual disturbance.  Respiratory: Negative for cough.   Cardiovascular: Negative for leg swelling.  Gastrointestinal: Negative for diarrhea and constipation.  Genitourinary: Negative for dysuria and frequency.       Periods are irregular, denies heavy bleeding  Musculoskeletal: Negative for myalgias and arthralgias.  Skin: Negative for rash.  Neurological: Negative for headaches.  Hematological: Negative for adenopathy.  Psychiatric/Behavioral:       Denies depression/anxiety   Past Medical History  Diagnosis Date  . Acute CVA (cerebrovascular accident) ([facility]) 01/23/2015  . 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 Hyperlipidemia 01/22/2015  . TIA (transient ischemic attack) 01/22/2015     Social History   Social History  . Marital Status: Single    Spouse Name: N/A  . Number of Children: N/A  . Years of Education: N/A   Occupational History  . Not on file.   Social History Main Topics  . Smoking status: Never Smoker   . Smokeless tobacco: Never Used  . Alcohol Use: 0.6 oz/week    1 Glasses of wine per week     Comment: casual  . Drug Use: Yes  . Sexual Activity: Not on file   Other Topics Concern  . 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   Completed college   Grew up in [place]- moved her in her mid 20's       Past Surgical History  Procedure Laterality Date  . Tee without cardioversion N/A 01/24/2015    Procedure: TRANSESOPHAGEAL ECHOCARDIOGRAM (TEE);  Surgeon: Pixie Casino, MD;  Location: [facility] ENDOSCOPY;  Service: Cardiovascular;  Laterality: N/A;  . Cardiac catheterization N/A 02/11/2015    Procedure: ASD Closure;  Surgeon: Sherren Mocha, MD;  Location: [facility] CV LAB;  Service: Cardiovascular;  Laterality: N/A;    Family History  Problem Relation Age of Onset  . Cancer Father 50    throat CA/smoker    No Known Allergies  Current Outpatient Prescriptions on File Prior to Visit  Medication Sig Dispense Refill  . aspirin 81 MG chewable tablet Chew 1 tablet (81 mg total) by mouth daily. 30 tablet 11  . atorvastatin (LIPITOR) 20 MG tablet TAKE 1 TABLET BY MOUTH DAILY AT 6 PM 90 tablet 2  . clopidogrel (PLAVIX) 75 MG tablet TAKE 1 TABLET (75 MG TOTAL) BY MOUTH DAILY. 30 tablet 9  . Multiple Vitamins-Minerals (MULTIVITAMIN WITH MINERALS) tablet Take 1 tablet by mouth daily.     No current facility-administered medications on  file prior to visit.    Pulse 67  Temp(Src) 98.1 F (36.7 C) (Oral)  Resp 16  Ht 5\' 10"  (1.778 m)  Wt 179 lb (81.194 kg)  BMI 25.68 kg/m2  SpO2 99%  LMP 07/12/2015       Objective:   Physical Exam   Physical Exam  Constitutional: She is oriented to person, place, and time. She appears well-developed and well-nourished. No distress.  HENT:  Head: Normocephalic and atraumatic.  Right Ear: Tympanic membrane and ear canal normal.  Left Ear: Tympanic membrane and ear canal normal.  Mouth/Throat: Oropharynx is clear and moist.  Eyes: Pupils are equal, round, and reactive to light. No scleral icterus.    Neck: Normal range of motion. No thyromegaly present.  Cardiovascular: Normal rate and regular rhythm.   No murmur heard. Pulmonary/Chest: Effort normal and breath sounds normal. No respiratory distress. He has no wheezes. She has no rales. She exhibits no tenderness.  Abdominal: Soft. Bowel sounds are normal. He exhibits no distension and no mass. There is no tenderness. There is no rebound and no guarding.  Musculoskeletal: She exhibits no edema.  Lymphadenopathy:    She has no cervical adenopathy.  Neurological: She is alert and oriented to person, place, and time. She has normal patellar reflexes. She exhibits normal muscle tone. Coordination normal.  Skin: Skin is warm and dry.  Psychiatric: She has a normal mood and affect. Her behavior is normal. Judgment and thought content normal.  Breasts: Examined lying Right: Without masses, retractions, discharge or axillary adenopathy.  Left: Without masses, retractions, discharge or axillary adenopathy.          Assessment & Plan:    Assessment & Plan:

## 2015-08-15 ENCOUNTER — Other Ambulatory Visit: Payer: Self-pay | Admitting: Family

## 2015-08-15 DIAGNOSIS — Z1231 Encounter for screening mammogram for malignant neoplasm of breast: Secondary | ICD-10-CM

## 2015-08-29 ENCOUNTER — Ambulatory Visit (INDEPENDENT_AMBULATORY_CARE_PROVIDER_SITE_OTHER): Payer: BLUE CROSS/BLUE SHIELD | Admitting: Neurology

## 2015-08-29 ENCOUNTER — Encounter: Payer: Self-pay | Admitting: Neurology

## 2015-08-29 ENCOUNTER — Ambulatory Visit: Payer: BLUE CROSS/BLUE SHIELD

## 2015-08-29 VITALS — BP 97/65 | HR 66 | Ht 70.0 in | Wt 179.0 lb

## 2015-08-29 DIAGNOSIS — Q2112 Patent foramen ovale: Secondary | ICD-10-CM

## 2015-08-29 DIAGNOSIS — Q211 Atrial septal defect: Secondary | ICD-10-CM

## 2015-08-29 NOTE — Progress Notes (Signed)
[facility] [address] [place] [address] [phone]       OFFICE FOLLOW-UP NOTE  Ms. Tiffany Gardner Date of Birth:  1970/04/26 Medical Record Number:  [id number]   HPI: [name] is seen today for the first office follow-up visit following hospital admission for stroke in November 2016.[name] is an 45 y.o. female who was at baseline while at work  On 01/22/15 At 10:50 she had dropped her key in her right hand and attempted to reach down to get them. She could not see the keys in her right visual field. She then stood in one place as she felt her right side did not feel right and she was dizzy. Coworkers noted something was off and drove her to the ED. On time of arrival her symptoms resolved. She has no PMHx and takes no medications. Modified Rankin: Rankin Score=0. She was last known well 01/22/2015 at 10:50. Patient was not administered TPA secondary to symptoms resolved. She was admitted for further evaluation and treatment. CT angiogram of the head and neck revealed no significant large vessel extracranial or intracranial stenosis. MRI scan of the brain which I personally reviewed showed small subtle acute infarcts involving left occipital lobe and left superior cerebellum. Patient had extensive stroke workup including transthoracic echo which was normal and trans-is a visual echo showed a complex fenestrated atrial septum defect and patent foramen ovale. Lower extremity venous Dopplers were negative for DVT. Hypercoagulable panel labs were all negative. HIV , RPR, ESR and ANA were normal. Patient was started on aspirin. She saw Dr. Sherren Mocha interventional cardiologist as outpatient and hand endovascular ASD and PFO closure on 02/11/15 and procedure went well. Intraoperative TEE showed adequate closure of the defect. Agitated saline study was not performed post closure. She is presently on aspirin 81 and Plavix 75 mg and tolerating fairly well with only minor  bruising and no bleeding. Her blood pressure is well controlled. Total cholesterol was slightly elevated at 218 and LDL at 153. She is also on Lipitor 20 mg she also she is tolerating well. Hemoglobin A1c was 6.1. She has returned back to work without restrictions. She does feel a little tired but otherwise has no complaints.  Update 08/29/2015 : She returns for follow-up after last visit 6 months ago. She continues to do well from neurovascular standpoint without recurrent stroke or TIA symptoms. She remains on aspirin 81 and Plavix 75 mg daily but does complain of significant bruising in her arms. She's had no bleeding episodes. She remains on Lipitor 20 mg she is tolerating well without muscle aches and pains. She had lipid profile checked last month by her primary physician which was satisfactory. The patient does complain of excessive tiredness. She has an appointment next month with Dr. Burt Knack. She's had some lab work done by her primary physician but no specific cause of her tiredness has been found. ROS:   14 system review of systems is positive for  tiredness. Fatigue. Bruising and all other systems negative  PMH:  Past Medical History  Diagnosis Date  . Acute CVA (cerebrovascular accident) ([facility]) 01/23/2015  . Acute embolic stroke ([facility])   . Hyperlipidemia 01/22/2015  . TIA (transient ischemic attack) 01/22/2015    Social History:  Social History   Social History  . Marital Status: Single    Spouse Name: N/A  . Number of Children: N/A  . Years of Education: N/A   Occupational History  . Not on file.  Social History Main Topics  . Smoking status: Never Smoker   . Smokeless tobacco: Never Used  . Alcohol Use: 0.6 oz/week    1 Glasses of wine per week     Comment: casual  . Drug Use: Yes  . Sexual Activity: Not on file   Other Topics Concern  . 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   Completed college   Grew up in [place]- moved her in her mid 20's       Medications:   Current Outpatient Prescriptions on File Prior to Visit  Medication Sig Dispense Refill  . aspirin 81 MG chewable tablet Chew 1 tablet (81 mg total) by mouth daily. 30 tablet 11  . atorvastatin (LIPITOR) 20 MG tablet TAKE 1 TABLET BY MOUTH DAILY AT 6 PM 90 tablet 2  . Multiple Vitamins-Minerals (MULTIVITAMIN WITH MINERALS) tablet Take 1 tablet by mouth daily.     No current facility-administered medications on file prior to visit.    Allergies:  No Known Allergies  Physical Exam General: well developed, well nourished middle aged Caucasian lady, seated, in no evident distress Head: head normocephalic and atraumatic.  Neck: supple with no carotid or supraclavicular bruits Cardiovascular: regular rate and rhythm, no murmurs Musculoskeletal: no deformity Skin:  no rash/petichiae Vascular:  Normal pulses all extremities Filed Vitals:   08/29/15 1037  BP: 97/65  Pulse: 66   Neurologic Exam Mental Status: Awake and fully alert. Oriented to place and time. Recent and remote memory intact. Attention span, concentration and fund of knowledge appropriate. Mood and affect appropriate.  Cranial Nerves: Fundoscopic exam not done. Pupils equal, briskly reactive to light. Extraocular movements full without nystagmus. Visual fields full to confrontation. Hearing intact. Facial sensation intact. Face, tongue, palate moves normally and symmetrically.  Motor: Normal bulk and tone. Normal strength in all tested extremity muscles. Sensory.: intact to touch ,pinprick .position and vibratory sensation.  Coordination: Rapid alternating movements normal in all extremities. Finger-to-nose and heel-to-shin performed accurately bilaterally. Gait and Station: Arises from chair without difficulty. Stance is normal. Gait demonstrates normal stride length and balance . Able  to heel, toe and tandem walk without difficulty.  Reflexes: 1+ and symmetric. Toes downgoing.      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    PLAN: I had a long d/w patient about her recent stroke,PFO closure trial results, risk for recurrent stroke/TIAs, personally independently reviewed imaging studies and stroke evaluation results and answered questions.Continue aspirin 81 mg daily and discontinue Plavix since she is having bruising and has been more than 6 months since her endovascular PFO closure for secondary stroke prevention and maintain strict control of  lipids with LDL cholesterol goal below 70 mg/dL. I also advised the patient to eat a healthy diet with plenty of whole grains, cereals, fruits and vegetables, exercise regularly and maintain ideal body weight .check follow-up transcranial Doppler bubble study to evaluate adequacy of endovascular PFO closure. Followup in the future with me in  the future only as necessary and no routine follow-up appointment is made. Antony Contras, MD Note: This document was prepared with digital dictation and possible smart phrase technology. Any transcriptional  errors that result from this process are unintentional

## 2015-08-29 NOTE — Patient Instructions (Addendum)
I had a long d/w patient about her recent stroke,PFO closure trial results, risk for recurrent stroke/TIAs, personally independently reviewed imaging studies and stroke evaluation results and answered questions.Continue aspirin 81 mg daily and discontinue Plavix since she is having bruising and has been more than 6 months since her endovascular PFO closure for secondary stroke prevention and maintain strict control of  lipids with LDL cholesterol goal below 70 mg/dL. I also advised the patient to eat a healthy diet with plenty of whole grains, cereals, fruits and vegetables, exercise regularly and maintain ideal body weight .check follow-up transcranial Doppler bubble study to evaluate adequacy of endovascular PFO closure. Followup in the future with me in  the future only as necessary and no routine follow-up appointment is made.

## 2015-08-30 ENCOUNTER — Encounter: Payer: Self-pay | Admitting: Cardiovascular Disease

## 2015-09-03 ENCOUNTER — Telehealth: Payer: Self-pay | Admitting: Cardiovascular Disease

## 2015-09-03 NOTE — Telephone Encounter (Signed)
New message      Pt states her neurologist suggested she stop her plavix because it has been over 6 months. Calling to see if Dr Burt Knack agrees.  Please call

## 2015-09-03 NOTE — Telephone Encounter (Signed)
Pt states she saw Dr Leonie Man recently, she was told she could discontinue Plavix by Dr Leonie Man.   Pt is asking if it is okay with Dr Burt Knack for her to discontinue Plavix  Pt is scheduled for bubble echo 09/12/15 and appt with Dr Burt Knack 09/20/15.   Pt advised I will forward to Dr Burt Knack for review, that he is out of the office this week and will probably be first of next week before this message is reviewed.   Pt states she will continue Plavix until she hears back from Dr Burt Knack.

## 2015-09-06 ENCOUNTER — Ambulatory Visit
Admission: RE | Admit: 2015-09-06 | Discharge: 2015-09-06 | Disposition: A | Discharge status: Home / Self Care | Payer: BLUE CROSS/BLUE SHIELD | Source: Ambulatory Visit | Attending: Family | Admitting: Family

## 2015-09-06 DIAGNOSIS — Z1231 Encounter for screening mammogram for malignant neoplasm of breast: Secondary | ICD-10-CM | POA: Diagnosis not present

## 2015-09-09 ENCOUNTER — Telehealth: Payer: Self-pay | Admitting: Neurology

## 2015-09-09 NOTE — Telephone Encounter (Signed)
Dr Burt Knack responded to the pt through My Chart:  From   Sherren Mocha, MD    To   [place] and [place]   09/09/2015 6:36 AM        Last Read in MyChart   Not Read       Yes - this is fine. It looks like it's been 6 months since the procedure so it's ok to stop plavix. thanks       I spoke with the pt and made her aware of this information. The pt has pending appointments with Dr Burt Knack.

## 2015-09-09 NOTE — Telephone Encounter (Signed)
Hey shannon Patient is ready to be scheduled at [facility] . NO PA required. Thanks Hinton Dyer -- Doppler.

## 2015-09-10 NOTE — Telephone Encounter (Signed)
Completed.

## 2015-09-12 ENCOUNTER — Other Ambulatory Visit: Payer: Self-pay

## 2015-09-12 ENCOUNTER — Ambulatory Visit (HOSPITAL_COMMUNITY): Payer: BLUE CROSS/BLUE SHIELD | Attending: Cardiology

## 2015-09-12 DIAGNOSIS — Q211 Atrial septal defect: Secondary | ICD-10-CM

## 2015-09-12 DIAGNOSIS — I517 Cardiomegaly: Secondary | ICD-10-CM | POA: Diagnosis not present

## 2015-09-12 DIAGNOSIS — Q2112 Patent foramen ovale: Secondary | ICD-10-CM

## 2015-09-20 ENCOUNTER — Encounter: Payer: Self-pay | Admitting: Cardiovascular Disease

## 2015-09-20 ENCOUNTER — Ambulatory Visit (INDEPENDENT_AMBULATORY_CARE_PROVIDER_SITE_OTHER): Payer: BLUE CROSS/BLUE SHIELD | Admitting: Cardiovascular Disease

## 2015-09-20 VITALS — BP 110/72 | HR 65 | Ht 70.0 in | Wt 180.4 lb

## 2015-09-20 DIAGNOSIS — Q211 Atrial septal defect, unspecified: Secondary | ICD-10-CM

## 2015-09-20 NOTE — Patient Instructions (Signed)

## 2015-09-20 NOTE — Progress Notes (Signed)
Cardiology Office Note Date:  09/22/2015   ID:  Kriselle Heavilin, DOB [birth year], MRN [id number]  PCP:  Nance Pear., NP  Cardiologist:  Sherren Mocha, MD    Chief Complaint  Patient presents with  . Follow-up    ASD closure    History of Present Illness: Tiffany Gardner is a 45 y.o. female who presents for follow-up evaluation. Patient is a previously healthy woman who presented in November 2016 with an acute stroke. Brain MRI demonstrated findings consistent with embolic infarcts. TEE demonstrated a multi-fenestrated secundum ASD and she underwent transcatheter ASD closure 02/11/2015 with 2 cribriform atrial septal occluder devices. Her procedure was uncomplicated. She presents today for follow-up.  She's doing well. The first few times she went hiking she complained of mild shortness of breath. Now without symptoms. She denies chest pain, chest pressure, lightheadedness, or heart palpitations. She is happy to be off of Plavix because she was bruising so easily.   Past Medical History:  Diagnosis Date  . Acute CVA (cerebrovascular accident) ([facility]) 01/23/2015  . 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 Hyperlipidemia 01/22/2015  . TIA (transient ischemic attack) 01/22/2015    Past Surgical History:  Procedure Laterality Date  . CARDIAC CATHETERIZATION N/A 02/11/2015   Procedure: ASD Closure;  Surgeon: Sherren Mocha, MD;  Location: [place] CV LAB;  Service: Cardiovascular;  Laterality: N/A;  . TEE WITHOUT CARDIOVERSION N/A 01/24/2015   Procedure: TRANSESOPHAGEAL ECHOCARDIOGRAM (TEE);  Surgeon: Pixie Casino, MD;  Location: [facility] ENDOSCOPY;  Service: Cardiovascular;  Laterality: N/A;    Current Outpatient Prescriptions  Medication Sig Dispense Refill  . aspirin 81 MG chewable tablet Chew 1 tablet (81 mg total) by mouth daily. 30 tablet 11  . atorvastatin (LIPITOR) 20 MG tablet TAKE 1 TABLET BY MOUTH DAILY AT 6 PM 90 tablet 2  . Multiple Vitamins-Minerals (MULTIVITAMIN WITH MINERALS)  tablet Take 1 tablet by mouth daily.     No current facility-administered medications for this visit.     Allergies:   Review of patient's allergies indicates no known allergies.   Social History:  The patient  reports that she has never smoked. She has never used smokeless tobacco. She reports that she drinks about 0.6 oz of alcohol per week . She reports that she uses drugs.   Family History:  The patient's  family history includes Cancer (age of onset: 39) in her father; Diabetes type I in her daughter.    ROS:  Please see the history of present illness.  All other systems are reviewed and negative.    PHYSICAL EXAM: VS:  BP 110/72   Pulse 65   Ht 5\' 10"  (1.778 m)   Wt 180 lb 6.4 oz (81.8 kg)   BMI 25.88 kg/m  , BMI Body mass index is 25.88 kg/m. GEN: Well nourished, well developed, in no acute distress  HEENT: normal  Neck: no JVD, no masses. No carotid bruits Cardiac: RRR without murmur or gallop                Respiratory:  clear to auscultation bilaterally, normal work of breathing GI: soft, nontender, nondistended, + BS MS: no deformity or atrophy  Ext: no pretibial edema, pedal pulses 2+= bilaterally Skin: warm and dry, no rash Neuro:  Strength and sensation are intact Psych: euthymic mood, full affect  EKG:  EKG is ordered today. The ekg ordered today shows normal sinus rhythm 65 bpm, within normal limits.  Recent Labs: 08/13/2015: ALT 23; BUN 19; Creatinine, Ser  0.87; Hemoglobin 13.4; Platelets 156.0; Potassium 3.7; Sodium 139; TSH 2.74   Lipid Panel     Component Value Date/Time   CHOL 174 08/13/2015 0752   TRIG 138.0 08/13/2015 0752   HDL 42.80 08/13/2015 0752   CHOLHDL 4 08/13/2015 0752   VLDL 27.6 08/13/2015 0752   LDLCALC 103 (H) 08/13/2015 0752      Wt Readings from Last 3 Encounters:  09/20/15 180 lb 6.4 oz (81.8 kg)  08/29/15 179 lb (81.2 kg)  08/13/15 179 lb (81.2 kg)     Cardiac Studies Reviewed: 2D Echo 09-13-2015: Study  Conclusions  - Left ventricle: The cavity size was normal. Systolic function was   normal. The estimated ejection fraction was in the range of 55%   to 60%. Wall motion was normal; there were no regional wall   motion abnormalities. - Right atrium: The atrium was moderately dilated. - Atrial septum: Increased thickness along the interatrial septum   s/p fenestrated ASD closure device. No evidence of residual shunt   by colorflow doppler or agitated saline contrast study.  Impressions:  - Normal LVF. S/P fenestrated ASD closure device with no residual   shunt. moderately dilated RA.  ASSESSMENT AND PLAN: 1.  Multi-fenestrated ASD: s/p transcatheter closure with 2 ASO devices. Doing well, now off of plavix. Should stay on lifelong ASA 81 mg. Repeat 2D echo in one year. TCD bubble study scheduled with Dr Leonie Man.  2. Hyperlipidemia: treated with atorvastatin. Most recent lipids reviewed.   Current medicines are reviewed with the patient today.  The patient does not have concerns regarding medicines.  Labs/ tests ordered today include:   Orders Placed This Encounter  Procedures  . EKG 12-Lead  . ECHOCARDIOGRAM COMPLETE   Disposition:   FU one year with an echo prior to that visit  Signed, Sherren Mocha, MD  09/22/2015 9:31 AM    [address], [place], [place]  [address] Phone: [phone]; Fax: [phone]

## 2015-09-25 ENCOUNTER — Telehealth: Payer: Self-pay | Admitting: Neurology

## 2015-09-25 ENCOUNTER — Ambulatory Visit (INDEPENDENT_AMBULATORY_CARE_PROVIDER_SITE_OTHER): Payer: BLUE CROSS/BLUE SHIELD

## 2015-09-25 DIAGNOSIS — Q211 Atrial septal defect: Secondary | ICD-10-CM

## 2015-09-25 DIAGNOSIS — Q2112 Patent foramen ovale: Secondary | ICD-10-CM

## 2015-09-25 NOTE — Progress Notes (Signed)
Patient in office for bubble study. Place 22 gauge in left ACx1 attempt. Clean with chlorhexidine prior to insertion. Blood return and flushes well. Secure with tegaderm and tape. Pt tolerate well with no complaints. No complaints of pain upon flushing IV. No signs of swelling or bruising. Site clean, dry, intact.

## 2015-09-25 NOTE — Telephone Encounter (Signed)
Patient called 9:14:21am to advise she is running behind for 9:30 [facility] [employer] [place] appointment today. She is currently by [facility] on [address], skyped Danielle who advised, patient can come to this appointment, patient advised.

## 2015-11-16 ENCOUNTER — Other Ambulatory Visit: Payer: Self-pay | Admitting: Cardiovascular Disease

## 2015-12-23 ENCOUNTER — Other Ambulatory Visit: Payer: Self-pay | Admitting: Physician Assistant

## 2016-05-05 ENCOUNTER — Encounter: Payer: BLUE CROSS/BLUE SHIELD | Admitting: Family

## 2016-06-02 ENCOUNTER — Other Ambulatory Visit: Payer: Self-pay | Admitting: Neurology

## 2016-06-02 DIAGNOSIS — Q2112 Patent foramen ovale: Secondary | ICD-10-CM

## 2016-06-02 DIAGNOSIS — Q211 Atrial septal defect: Secondary | ICD-10-CM

## 2016-07-24 ENCOUNTER — Other Ambulatory Visit: Payer: Self-pay | Admitting: Cardiovascular Disease

## 2016-07-31 DIAGNOSIS — K1329 Other disturbances of oral epithelium, including tongue: Secondary | ICD-10-CM | POA: Diagnosis not present

## 2016-09-18 ENCOUNTER — Other Ambulatory Visit: Payer: Self-pay | Admitting: Physician Assistant

## 2016-10-16 ENCOUNTER — Other Ambulatory Visit: Payer: Self-pay | Admitting: *Deleted

## 2016-10-16 MED ORDER — ATORVASTATIN CALCIUM 20 MG PO TABS: ORAL_TABLET | ORAL | 0 refills | Status: DC

## 2016-11-25 ENCOUNTER — Ambulatory Visit (HOSPITAL_COMMUNITY): Payer: BLUE CROSS/BLUE SHIELD | Attending: Cardiology

## 2016-11-25 ENCOUNTER — Encounter: Payer: Self-pay | Admitting: Cardiovascular Disease

## 2016-11-25 ENCOUNTER — Ambulatory Visit (INDEPENDENT_AMBULATORY_CARE_PROVIDER_SITE_OTHER): Payer: BLUE CROSS/BLUE SHIELD | Admitting: Cardiovascular Disease

## 2016-11-25 ENCOUNTER — Other Ambulatory Visit: Payer: Self-pay

## 2016-11-25 VITALS — BP 104/60 | HR 60 | Ht 70.0 in | Wt 191.8 lb

## 2016-11-25 DIAGNOSIS — Q211 Atrial septal defect, unspecified: Secondary | ICD-10-CM

## 2016-11-25 DIAGNOSIS — Z8673 Personal history of transient ischemic attack (TIA), and cerebral infarction without residual deficits: Secondary | ICD-10-CM | POA: Insufficient documentation

## 2016-11-25 DIAGNOSIS — I071 Rheumatic tricuspid insufficiency: Secondary | ICD-10-CM | POA: Diagnosis not present

## 2016-11-25 DIAGNOSIS — I371 Nonrheumatic pulmonary valve insufficiency: Secondary | ICD-10-CM | POA: Insufficient documentation

## 2016-11-25 DIAGNOSIS — E785 Hyperlipidemia, unspecified: Secondary | ICD-10-CM | POA: Diagnosis not present

## 2016-11-25 DIAGNOSIS — Q2111 Secundum atrial septal defect: Secondary | ICD-10-CM

## 2016-11-25 NOTE — Patient Instructions (Signed)

## 2016-11-25 NOTE — Progress Notes (Signed)
Cardiology Office Note Date:  11/25/2016   ID:  Tiffany Gardner, DOB September 11, 1970, MRN [id number]  PCP:  [name], NP  Cardiologist:  Sherren Mocha, MD    Chief Complaint  Patient presents with  . Follow-up     History of Present Illness: Tiffany Gardner is a 46 y.o. female who presents for follow-up evaluation. 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 She underwent transcatheter ASD closure 02/11/2015 with 2 cribriform atrial septal occluder devices.  She is here alone today. Complains of mild shortness of breath with hiking up hills (5 or 6 miles). Otherwise she feels well and has no complaints. Today, she denies symptoms of palpitations, chest pain, orthopnea, PND, lower extremity edema, dizziness, or syncope. She's had no recurrent stroke or TIA symptoms.   Past Medical History:  Diagnosis Date  . Acute CVA (cerebrovascular accident) ([place]) 01/23/2015  . 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 Hyperlipidemia 01/22/2015  . TIA (transient ischemic attack) 01/22/2015    Past Surgical History:  Procedure Laterality Date  . CARDIAC CATHETERIZATION N/A 02/11/2015   Procedure: ASD Closure;  Surgeon: Sherren Mocha, MD;  Location: [place] [facility];  Service: Cardiovascular;  Laterality: N/A;  . TEE WITHOUT CARDIOVERSION N/A 01/24/2015   Procedure: TRANSESOPHAGEAL ECHOCARDIOGRAM (TEE);  Surgeon: Pixie Casino, MD;  Location: [facility] ENDOSCOPY;  Service: Cardiovascular;  Laterality: N/A;    Current Outpatient Prescriptions  Medication Sig Dispense Refill  . atorvastatin (LIPITOR) 20 MG tablet TAKE 1 TABLET BY MOUTH DAILY AT 6 PM Please keep upcoming appointment for further refills 90 tablet 0  . CVS ASPIRIN ADULT LOW DOSE 81 MG chewable tablet CHEW 1 TABLET (81 MG TOTAL) BY MOUTH DAILY. 30 tablet 0  . Multiple Vitamins-Minerals (MULTIVITAMIN WITH MINERALS) tablet Take 1 tablet by mouth daily.     No current facility-administered medications for this  visit.     Allergies:   Patient has no known allergies.   Social History:  The patient  reports that she has never smoked. She has never used smokeless tobacco. She reports that she drinks about 0.6 oz of alcohol per week . She reports that she uses drugs.   Family History:  The patient's  family history includes Cancer (age of onset: 53) in her father; Diabetes type I in her daughter.    ROS:  Please see the history of present illness.  All other systems are reviewed and negative.    PHYSICAL EXAM: VS:  BP 104/60   Pulse 60   Ht 5\' 10"  (1.778 m)   Wt 87 kg (191 lb 12.8 oz)   BMI 27.52 kg/m  , BMI Body mass index is 27.52 kg/m. GEN: Well nourished, well developed, in no acute distress  HEENT: normal  Neck: no JVD, no masses. No carotid bruits Cardiac: RRR without murmur or gallop                Respiratory:  clear to auscultation bilaterally, normal work of breathing GI: soft, nontender, nondistended, + BS MS: no deformity or atrophy  Ext: no pretibial edema, pedal pulses 2+= bilaterally Skin: warm and dry, no rash Neuro:  Strength and sensation are intact Psych: euthymic mood, full affect  EKG:  EKG is ordered today. The ekg ordered today shows NSR 60 bpm, nonspecific ST abnormality, otherwise within normal limits  Recent Labs: No results found for requested labs within last 8760 hours.   Lipid Panel  Component Value Date/Time   CHOL 174 08/13/2015 0752   TRIG 138.0 08/13/2015 0752   HDL 42.80 08/13/2015 0752   CHOLHDL 4 08/13/2015 0752   VLDL 27.6 08/13/2015 0752   LDLCALC 103 (H) 08/13/2015 0752      Wt Readings from Last 3 Encounters:  11/25/16 87 kg (191 lb 12.8 oz)  09/20/15 81.8 kg (180 lb 6.4 oz)  08/29/15 81.2 kg (179 lb)     Cardiac Studies Reviewed: Echo: formal interpretation pending. LV function is normal, physiologic trivial MR/TR noted. ASO devices in appropriate position in the interatrial septum. There is a small color flow jet seen near  the devices on the RA side. No color flow seen crossing the septum (previous bubble negative post-device closure).   ASSESSMENT AND PLAN: 1.  Fenestrated secundum ASD s/p transcatheter closure: continues to do well. Echo reviewed as above will review again when formal interpretation completed.   2. Exertional dyspnea, mild. Echo appears normal. No clear cardiac etiology of symptoms. Advised graded exercise program.   Current medicines are reviewed with the patient today.  The patient does not have concerns regarding medicines.  Labs/ tests ordered today include:   Orders Placed This Encounter  Procedures  . EKG 12-Lead    Disposition:   FU one year  Signed, Sherren Mocha, MD  11/25/2016 1:40 PM    [facility] [address], [place], [place]  [address] Phone: [phone]; Fax: [phone]

## 2017-01-25 ENCOUNTER — Other Ambulatory Visit: Payer: Self-pay | Admitting: Cardiovascular Disease

## 2017-03-07 IMAGING — MR MR MRA HEAD W/O CM
9 of 12 series · 29 of 48 positions shown · non-contrast
Comparison: CTA head and neck 6052 hours today.

ADDENDUM:
Study discussed by telephone with Dr. MISIER HAMRITTE on 01/22/2015
at 6648 hours.
CLINICAL DATA: 44-year-old female with acute onset right visual
field changes at 3727 hours today, associated with dizziness.
Initial encounter.

EXAM:
MRI HEAD WITHOUT CONTRAST
MRA HEAD WITHOUT CONTRAST
TECHNIQUE: Multiplanar, multiecho pulse sequences of the brain and surrounding
structures were obtained without intravenous contrast. Angiographic
images of the head were obtained using MRA technique without
contrast.

[Series 3: DWI · axial · 3.0mm · 1.09mm/px · z∈[-83,+44]mm · 6 of 90 slices shown (1 of 4)]
[im 1/90]
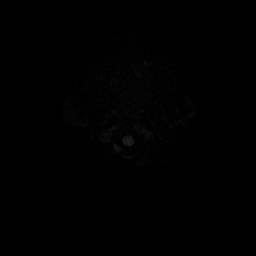
[im 18/90]
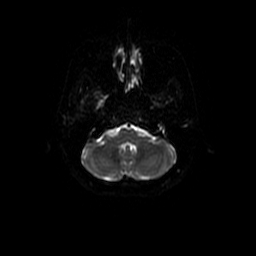
[im 36/90]
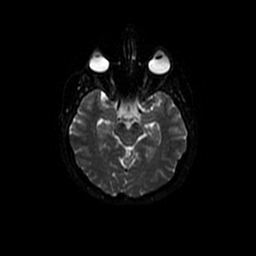
[im 54/90]
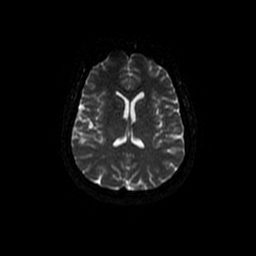
[im 72/90]
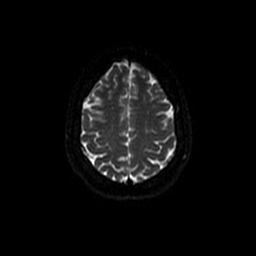
[im 90/90]
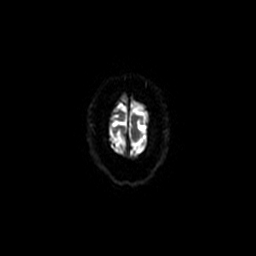

[Series 4: DWI · coronal · 5.0mm · 1.09mm/px · 5 of 66 slices shown (2 of 4)]
[im 1/66]
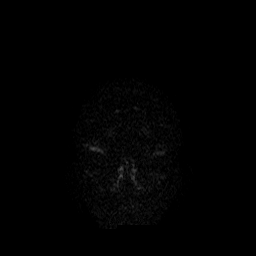
[im 17/66]
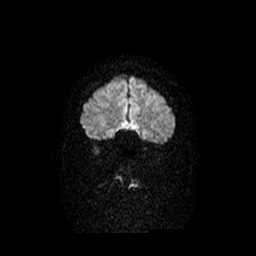
[im 33/66]
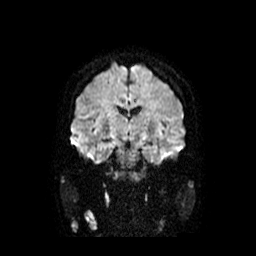
[im 49/66]
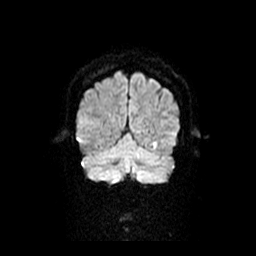
[im 66/66]
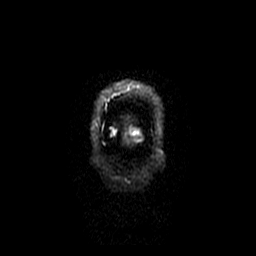

[Series 5: (id) mt fs · axial · 1.2mm · 0.43mm/px · z∈[-65,-16]mm · 5 of 154 slices shown]
[im 1/154]
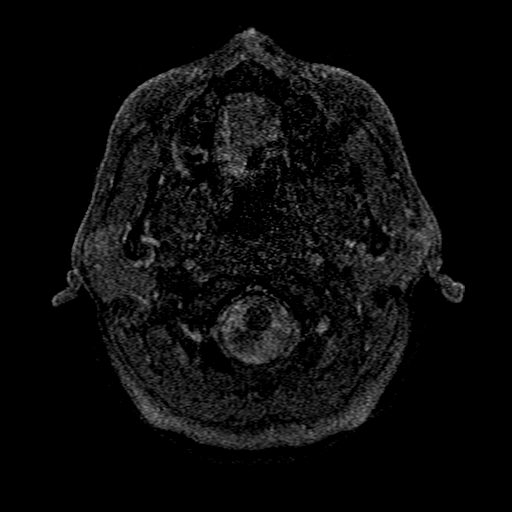
[im 28/154]
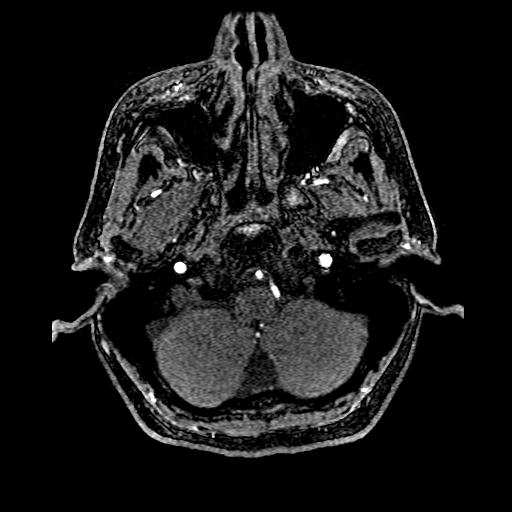
[im 42/154]
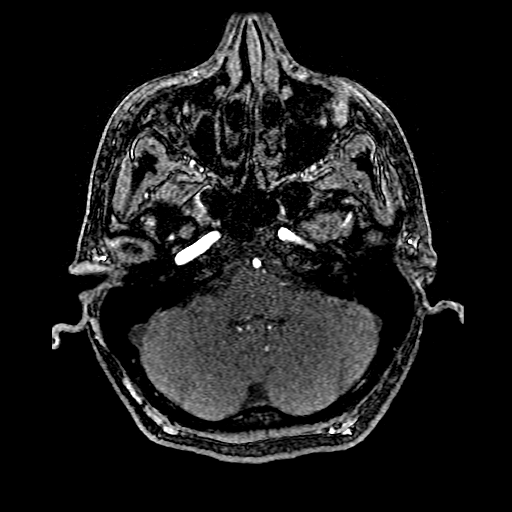
[im 70/154]
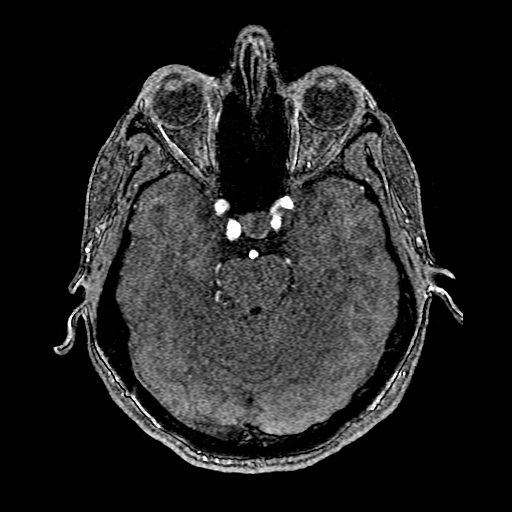
[im 84/154]
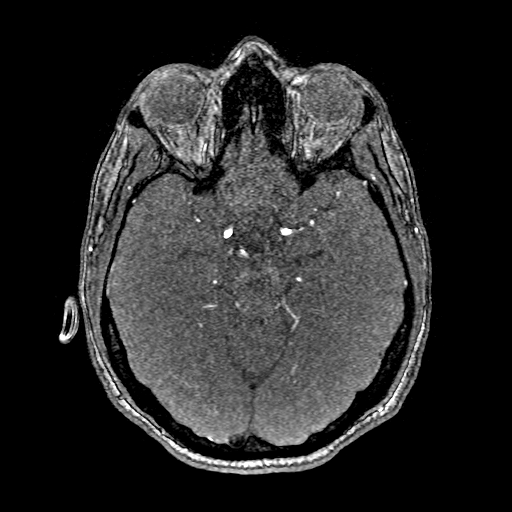

[Series 6: T1 · sagittal · 5.0mm · 0.47mm/px · 2 of 23 slices shown]
[im 1/23]
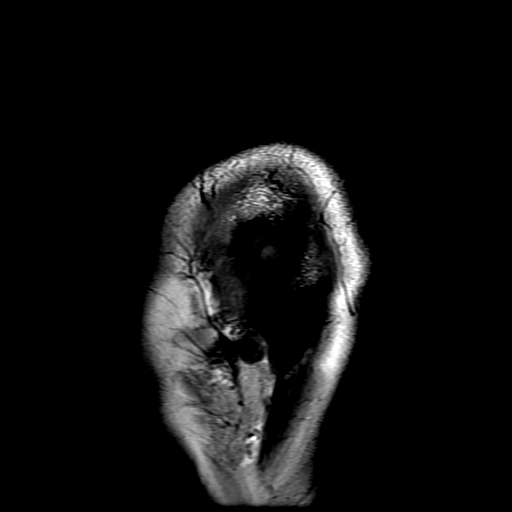
[im 23/23]
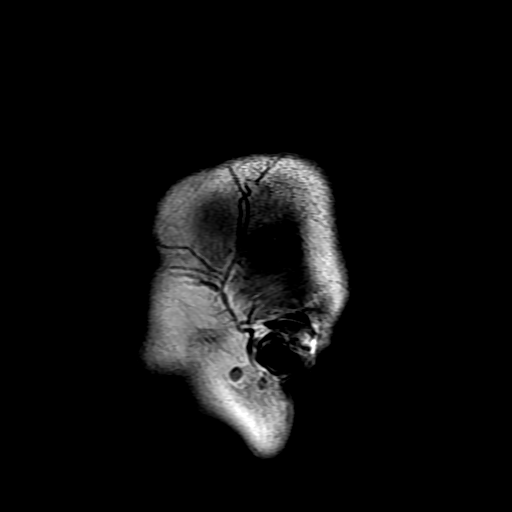

[Series 7: T2 · axial · 5.0mm · 0.43mm/px · z∈[-88,+50]mm · 2 of 25 slices shown (1 of 2)]
[im 1/25]
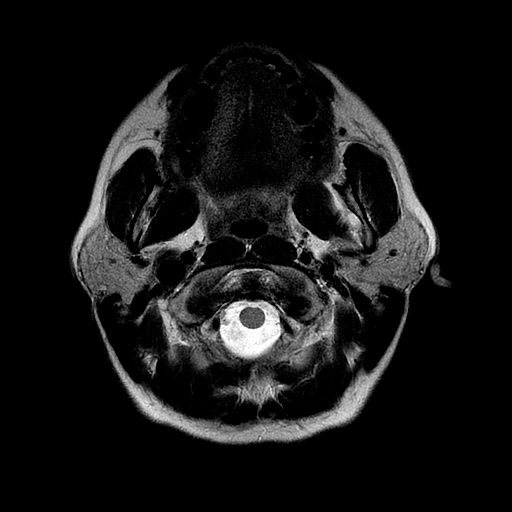
[im 25/25]
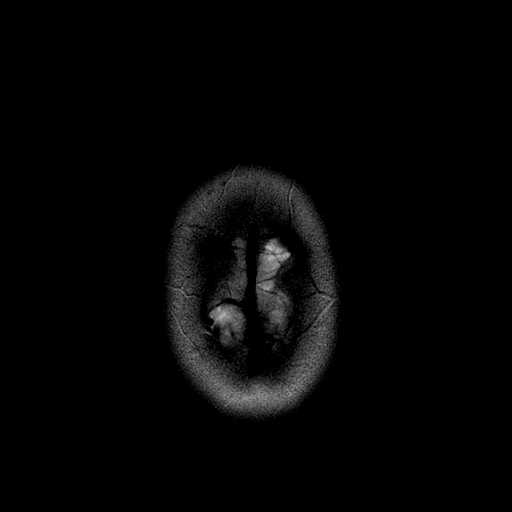

[Series 8: FLAIR · axial · 5.0mm · 0.43mm/px · z∈[-88,+50]mm · 2 of 25 slices shown]
[im 1/25]
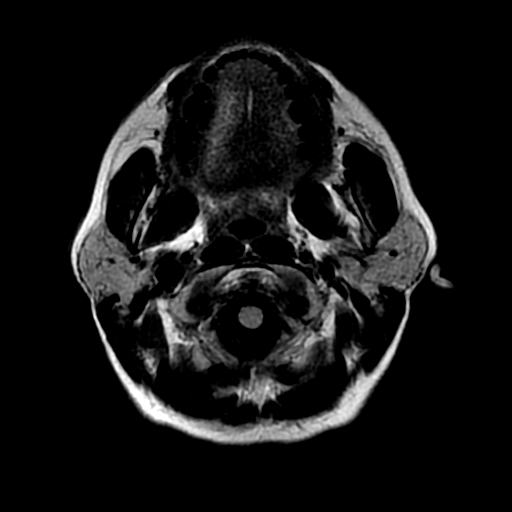
[im 25/25]
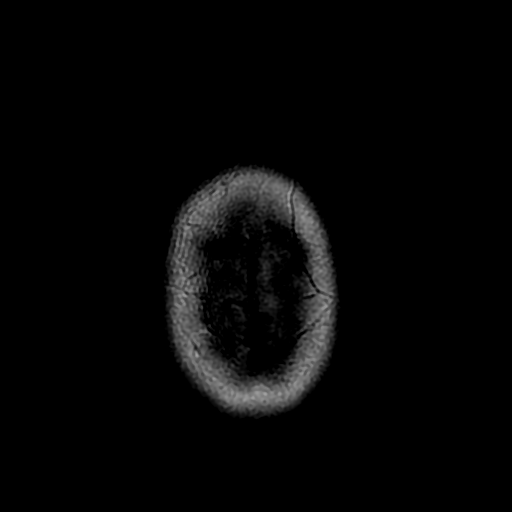

[Series 11: T2 · coronal · 5.0mm · 0.43mm/px · 2 of 28 slices shown (2 of 2)]
[im 1/28]
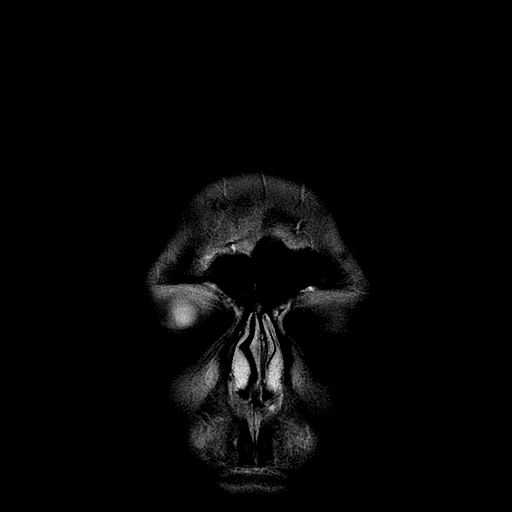
[im 28/28]
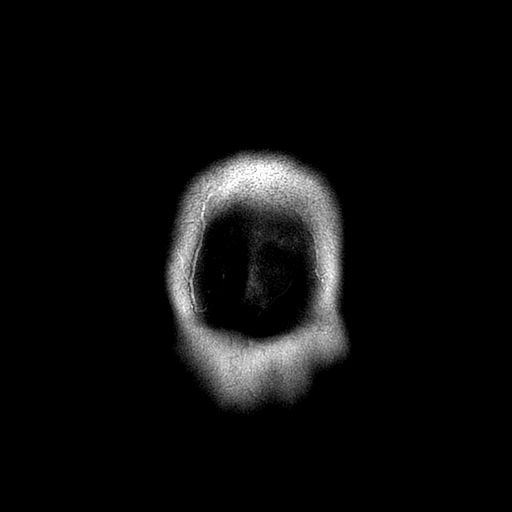

[Series 300: DWI · axial · 3.0mm · 1.09mm/px · z∈[-83,+44]mm · 3 of 45 slices shown (3 of 4)]
[im 1/45]
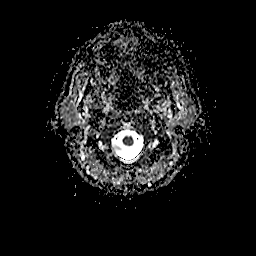
[im 23/45]
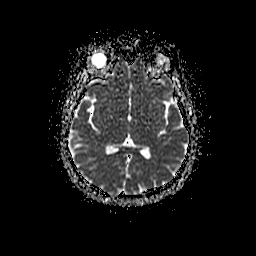
[im 45/45]
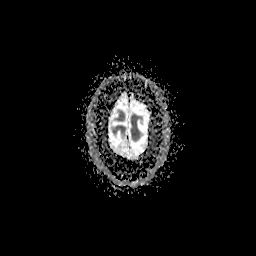

[Series 400: DWI · coronal · 5.0mm · 1.09mm/px · 2 of 33 slices shown (4 of 4)]
[im 1/33]
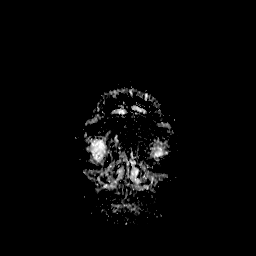
[im 33/33]
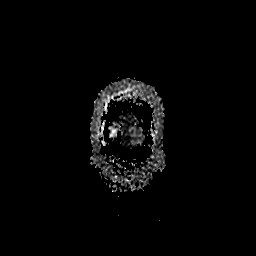

[29 of 48 positions shown; findings below may reference images not displayed]

FINDINGS: MRI HEAD FINDINGS

There are 2 small foci of restricted diffusion in the left posterior
circulation. The larger is in the inferior left occipital lobe seen
on series 3, image 31. There is then a subtle linear area of
restricted diffusion in the left superior cerebellum (image 34).
Both of these are visible on coronal diffusion imaging also. There
is no associated parenchymal T2 or FLAIR hyperintensity. No
associated hemorrhage or mass effect. There is subtle increased
FLAIR signal in the left PCA branch visible on series 8, image 11.

Major intracranial vascular flow voids are within normal limits. No
other restricted diffusion.

Gray and white matter signal elsewhere is within normal limits. No
chronic cerebral blood products. No midline shift, mass effect,
evidence of mass lesion, ventriculomegaly, extra-axial collection or
acute intracranial hemorrhage. Cervicomedullary junction and
pituitary are within normal limits. Negative visualized cervical
spine.

Normal bone marrow signal. Visible internal auditory structures
appear normal. Mastoids are clear. Mild paranasal sinus mucosal
thickening. Optic chiasm and bilateral intraorbital soft tissues are
normal. Negative scalp soft tissues.

MRA HEAD FINDINGS

Antegrade flow in the posterior circulation with codominant distal
vertebral arteries. No distal vertebral artery stenosis. Bilateral
PICA flow is present. Normal vertebrobasilar junction. No basilar
stenosis. SCA and PCA origins are normal. Posterior communicating
arteries are diminutive or absent. Bilateral PCA branches appear
normal, including those on the left where subtle increased FLAIR
signal was noted.

Antegrade flow in both ICA siphons. No siphon stenosis. Normal
ophthalmic artery origins. Normal carotid termini, MCA and ACA
origins. Diminutive anterior communicating artery. Visualized
bilateral ACA branches, and visualized bilateral MCA branches are
within normal limits.
IMPRESSION: 1. Positive for small/subtle acute infarcts in the left occipital
lobe and left superior cerebellum. No associated hemorrhage or mass
effect.
2.  Negative intracranial MRA.
3. Otherwise normal noncontrast MRI appearance of the brain.

## 2017-07-05 ENCOUNTER — Encounter: Payer: Self-pay | Admitting: Family

## 2017-07-05 ENCOUNTER — Ambulatory Visit (INDEPENDENT_AMBULATORY_CARE_PROVIDER_SITE_OTHER): Payer: BLUE CROSS/BLUE SHIELD | Admitting: Family

## 2017-07-05 VITALS — BP 129/68 | HR 82 | Temp 98.3°F | Resp 16 | Ht 69.0 in | Wt 190.2 lb

## 2017-07-05 DIAGNOSIS — Z0000 Encounter for general adult medical examination without abnormal findings: Secondary | ICD-10-CM | POA: Diagnosis not present

## 2017-07-05 DIAGNOSIS — R21 Rash and other nonspecific skin eruption: Secondary | ICD-10-CM | POA: Diagnosis not present

## 2017-07-05 LAB — URINALYSIS, ROUTINE W REFLEX MICROSCOPIC
Bilirubin Urine: NEGATIVE
Hgb urine dipstick: NEGATIVE
KETONES UR: NEGATIVE
Leukocytes, UA: NEGATIVE
Nitrite: NEGATIVE
RBC / HPF: NONE SEEN (ref 0–?)
Specific Gravity, Urine: >=1.03 — AB (ref 1.000–1.030)
Total Protein, Urine: NEGATIVE
Urine Glucose: NEGATIVE
Urobilinogen, UA: 0.2 (ref 0.0–1.0)
pH: 5 (ref 5.0–8.0)

## 2017-07-05 LAB — LIPID PANEL
Cholesterol: 185 mg/dL (ref 0–200)
HDL: 49 mg/dL (ref >39.00)
LDL CALC: 105 mg/dL — AB (ref 0–99)
NonHDL: 136
TRIGLYCERIDES: 155 mg/dL — AB (ref 0.0–149.0)
Total CHOL/HDL Ratio: 4
VLDL: 31 mg/dL (ref 0.0–40.0)

## 2017-07-05 LAB — HEPATIC FUNCTION PANEL
ALBUMIN: 4.4 g/dL (ref 3.5–5.2)
ALT: 21 U/L (ref 0–35)
AST: 19 U/L (ref 0–37)
Alkaline Phosphatase: 102 U/L (ref 39–117)
BILIRUBIN TOTAL: 0.7 mg/dL (ref 0.2–1.2)
Bilirubin, Direct: 0.1 mg/dL (ref 0.0–0.3)
TOTAL PROTEIN: 7.2 g/dL (ref 6.0–8.3)

## 2017-07-05 LAB — CBC WITH DIFFERENTIAL/PLATELET
BASOS ABS: 0 10*3/uL (ref 0.0–0.1)
BASOS PCT: 0.5 % (ref 0.0–3.0)
EOS ABS: 0.1 10*3/uL (ref 0.0–0.7)
Eosinophils Relative: 0.9 % (ref 0.0–5.0)
HEMATOCRIT: 41.7 % (ref 36.0–46.0)
HEMOGLOBIN: 14.2 g/dL (ref 12.0–15.0)
LYMPHS PCT: 27.5 % (ref 12.0–46.0)
Lymphs Abs: 1.7 10*3/uL (ref 0.7–4.0)
MCHC: 34.1 g/dL (ref 30.0–36.0)
MCV: 91.1 fl (ref 78.0–100.0)
Monocytes Absolute: 0.4 10*3/uL (ref 0.1–1.0)
Monocytes Relative: 6.5 % (ref 3.0–12.0)
Neutro Abs: 3.9 10*3/uL (ref 1.4–7.7)
Neutrophils Relative %: 64.6 % (ref 43.0–77.0)
Platelets: 213 10*3/uL (ref 150.0–400.0)
RBC: 4.57 Mil/uL (ref 3.87–5.11)
RDW: 12.5 % (ref 11.5–15.5)
WBC: 6.1 10*3/uL (ref 4.0–10.5)

## 2017-07-05 LAB — BASIC METABOLIC PANEL
BUN: 13 mg/dL (ref 6–23)
CHLORIDE: 103 meq/L (ref 96–112)
CO2: 27 meq/L (ref 19–32)
CREATININE: 0.91 mg/dL (ref 0.40–1.20)
Calcium: 9.7 mg/dL (ref 8.4–10.5)
GFR: 70.39 mL/min (ref >60.00)
GLUCOSE: 91 mg/dL (ref 70–99)
Potassium: 4 mEq/L (ref 3.5–5.1)
Sodium: 139 mEq/L (ref 135–145)

## 2017-07-05 NOTE — Progress Notes (Addendum)
Subjective:    Patient ID: Donni Oglesby, female    DOB: 1970/07/11, 47 y.o.   MRN: [id number]  HPI  Ms. Finnie is a 47 yr old female who presents today for cpx.  Patient presents today for complete physical.  Immunizations: Tdap 2014 Diet: reports healthy diet Exercise:walks, hikes, yoga, weight training 4-5 days a week Colonoscopy: will begin at 50 Pap Smear: 2017, had done in [place] and was reportedly normal.  Mammogram: due Dental: 7 months ago- has apt Vision: 1 year ago Wt Readings from Last 3 Encounters:  07/05/17 190 lb 3.2 oz (86.3 kg)  11/25/16 191 lb 12.8 oz (87 kg)  09/20/15 180 lb 6.4 oz (81.8 kg)   C/o pruritic rash bilateral UE   Review of Systems  Constitutional: Negative for unexpected weight change.  HENT: Negative for hearing loss and rhinorrhea.   Eyes: Negative for visual disturbance.  Respiratory: Negative for cough.   Cardiovascular: Negative for leg swelling.  Gastrointestinal: Negative for constipation, diarrhea, nausea and vomiting.  Genitourinary: Negative for dysuria, frequency and hematuria.  Musculoskeletal: Negative for arthralgias and myalgias.  Skin: Positive for rash.  Neurological: Negative for headaches.  Hematological: Negative for adenopathy.  Psychiatric/Behavioral:       Denies depression/anxiety   Past Medical History:  Diagnosis Date  . Acute CVA (cerebrovascular accident) ([place]) 01/23/2015  . 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 Hyperlipidemia 01/22/2015  . TIA (transient ischemic attack) 01/22/2015     Social History   Socioeconomic History  . Marital status: Single    Spouse name: Not on file  . Number of children: Not on file  . Years of education: Not on file  . Highest education level: Not on file  Occupational History  . Not on file  Social Needs  . Financial resource strain: Not on file  . Food insecurity:    Worry: Not on file    Inability: Not on file  . Transportation needs:    Medical: Not on file   Non-medical: Not on file  Tobacco Use  . Smoking status: Never Smoker  . Smokeless tobacco: Never Used  Substance and Sexual Activity  . Alcohol use: Yes    Alcohol/week: 0.6 oz    Types: 1 Glasses of wine per week    Comment: casual  . Drug use: Yes  . Sexual activity: Not on file  Lifestyle  . Physical activity:    Days per week: Not on file    Minutes per session: Not on file  . Stress: Not on file  Relationships  . Social connections:    Talks on phone: Not on file    Gets together: Not on file    Attends religious service: Not on file    Active member of club or organization: Not on file    Attends meetings of clubs or organizations: Not on file    Relationship status: Not on file  . Intimate partner violence:    Fear of current or ex partner: Not on file    Emotionally abused: Not on file    Physically abused: Not on file    Forced sexual activity: Not on file  Other Topics Concern  . 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   Completed college   Grew up in [place]- moved her in  her mid 20's    Past Surgical History:  Procedure Laterality Date  . CARDIAC CATHETERIZATION N/A 02/11/2015   Procedure: ASD Closure;  Surgeon: Sherren Mocha, MD;  Location: [facility];  Service: Cardiovascular;  Laterality: N/A;  . TEE WITHOUT CARDIOVERSION N/A 01/24/2015   Procedure: TRANSESOPHAGEAL ECHOCARDIOGRAM (TEE);  Surgeon: Pixie Casino, MD;  Location: [facility] ENDOSCOPY;  Service: Cardiovascular;  Laterality: N/A;    Family History  Problem Relation Age of Onset  . Cancer Father 50       throat CA/smoker  . Diabetes type I Daughter     No Known Allergies  Current Outpatient Medications on File Prior to Visit  Medication Sig Dispense Refill  . atorvastatin (LIPITOR) 20 MG tablet Take 1 tablet (20 mg total) by mouth daily at 6 PM. 90  tablet 3  . CVS ASPIRIN ADULT LOW DOSE 81 MG chewable tablet CHEW 1 TABLET (81 MG TOTAL) BY MOUTH DAILY. 30 tablet 0  . Multiple Vitamins-Minerals (MULTIVITAMIN WITH MINERALS) tablet Take 1 tablet by mouth daily.     No current facility-administered medications on file prior to visit.     BP 129/68 (BP Location: Right Arm, Cuff Size: Normal)   Pulse 82   Temp 98.3 F (36.8 C) (Oral)   Resp 16   Ht 5\' 9"  (1.753 m)   Wt 190 lb 3.2 oz (86.3 kg)   LMP 02/24/2015   SpO2 100%   BMI 28.09 kg/m       Objective:   Physical Exam Physical Exam  Constitutional: She is oriented to person, place, and time. She appears well-developed and well-nourished. No distress.  HENT:  Head: Normocephalic and atraumatic.  Right Ear: Tympanic membrane and ear canal normal.  Left Ear: Tympanic membrane and ear canal normal.  Mouth/Throat: Oropharynx is clear and moist.  Eyes: Pupils are equal, round, and reactive to light. No scleral icterus.  Neck: Normal range of motion. No thyromegaly present.  Cardiovascular: Normal rate and regular rhythm.   No murmur heard. Pulmonary/Chest: Effort normal and breath sounds normal. No respiratory distress. He has no wheezes. She has no rales. She exhibits no tenderness.  Abdominal: Soft. Bowel sounds are normal. She exhibits no distension and no mass. There is no tenderness. There is no rebound and no guarding.  Musculoskeletal: She exhibits no edema.  Lymphadenopathy:    She has no cervical adenopathy.  Neurological: She is alert and oriented to person, place, and time. She has normal patellar reflexes. She exhibits normal muscle tone. Coordination normal.  Skin: Skin is warm and dry. Rash noted in patches of blisters on bilateral forearm Psychiatric: She has a normal mood and affect. Her behavior is normal. Judgment and thought content normal.  Breasts: Examined lying Right: Without masses, retractions, discharge or axillary adenopathy.  Left: Without masses,  retractions, discharge or axillary adenopathy.  Pelvic: deferred         Assessment & Plan:         Assessment & Plan:  Preventative care- discussed healthy diet, exercise and weight loss.  Pap up to date, mammo due and ordered. Obtain routine lab work.    Skin rash- looks like mild poison ivy. Perhaps she got from her pets.  Rx with triamcinolone cream.

## 2017-07-05 NOTE — Patient Instructions (Addendum)
Please complete lab work prior to leaving. You should be contacted about scheduling your mammogram at the breast center.

## 2017-07-06 ENCOUNTER — Encounter: Payer: Self-pay | Admitting: Family

## 2017-07-06 LAB — TSH: TSH: 3.69 u[IU]/mL (ref 0.35–4.50)

## 2017-07-06 MED ORDER — TRIAMCINOLONE ACETONIDE 0.1 % EX CREA: 1.0000 "application " | TOPICAL_CREAM | Freq: Two times a day (BID) | CUTANEOUS | 0 refills | Status: DC

## 2017-07-06 NOTE — Addendum Note (Signed)
Addended by: [name] on: 07/06/2017 08:15 PM   Modules accepted: Orders

## 2017-08-31 ENCOUNTER — Ambulatory Visit
Admission: RE | Admit: 2017-08-31 | Discharge: 2017-08-31 | Disposition: A | Discharge status: Home / Self Care | Payer: BLUE CROSS/BLUE SHIELD | Source: Ambulatory Visit | Attending: Family | Admitting: Family

## 2017-08-31 DIAGNOSIS — Z0000 Encounter for general adult medical examination without abnormal findings: Secondary | ICD-10-CM

## 2017-08-31 DIAGNOSIS — Z1231 Encounter for screening mammogram for malignant neoplasm of breast: Secondary | ICD-10-CM | POA: Diagnosis not present

## 2017-12-29 ENCOUNTER — Other Ambulatory Visit: Payer: Self-pay | Admitting: Cardiovascular Disease

## 2017-12-29 MED ORDER — ATORVASTATIN CALCIUM 20 MG PO TABS: 20.0000 mg | ORAL_TABLET | Freq: Every day | ORAL | 0 refills | Status: DC

## 2017-12-29 NOTE — Telephone Encounter (Signed)
°*  STAT* If patient is at the pharmacy, call can be transferred to refill team.   1. Which medications need to be refilled? (please list name of each medication and dose if known) atorvastatin 20 gm  2. Which pharmacy/location (including street and city if local pharmacy) is medication to be sent to? [facility]   3. Do they need a 30 day or 90 day supply? [place]

## 2017-12-29 NOTE — Telephone Encounter (Signed)
Pt's medication was sent to pt's pharmacy as requested. Confirmation received.  °

## 2018-01-07 ENCOUNTER — Ambulatory Visit: Payer: BLUE CROSS/BLUE SHIELD | Admitting: Cardiovascular Disease

## 2018-04-07 ENCOUNTER — Other Ambulatory Visit: Payer: Self-pay | Admitting: Cardiovascular Disease

## 2018-04-11 ENCOUNTER — Other Ambulatory Visit: Payer: Self-pay

## 2018-04-11 MED ORDER — ATORVASTATIN CALCIUM 20 MG PO TABS: 20.0000 mg | ORAL_TABLET | Freq: Every day | ORAL | 0 refills | Status: DC

## 2018-06-16 ENCOUNTER — Telehealth: Payer: Self-pay | Admitting: *Deleted

## 2018-06-16 NOTE — Telephone Encounter (Signed)
S/w pt about upcoming appt with Dr. Burt Knack.  Due to Dr.Coopers availability pt was offered to see APP with a [place] visit or go on a wait list/recall for Dr.Cooper in October.  Pt is asymptomatic and would prefer recall/wait list.  Recall placed in system today, pt put on wait list. Pt is aware of and symptoms occur prior to visit call office.

## 2018-07-22 ENCOUNTER — Ambulatory Visit: Payer: BLUE CROSS/BLUE SHIELD | Admitting: Cardiovascular Disease

## 2018-07-24 ENCOUNTER — Other Ambulatory Visit: Payer: Self-pay | Admitting: Cardiovascular Disease

## 2018-10-19 ENCOUNTER — Other Ambulatory Visit: Payer: Self-pay | Admitting: Cardiovascular Disease

## 2018-11-21 NOTE — Progress Notes (Signed)
Cardiology Office Note    Date:  11/22/2018   ID:  Tiffany Gardner, DOB Mar 27, 1970, MRN [id number]  PCP:  Tiffany Alar, NP  Cardiologist:  Dr. Burt Gardner  Chief Complaint: 24   Months follow up  History of Present Illness:   Tiffany Gardner is a 48 y.o. female seen for follow up.  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 She underwent transcatheter ASD closure 02/11/2015 with 2 cribriform atrial septal occluder devices.  She was doing okay when last seen by Dr. Burt Gardner 11/2016.  Here today for follow up. She has chronic stable dyspnea after heavy exertional. "shortness of breath occurs after 8-10 mills of hiking". This been stable for years. The patient denies nausea, vomiting, fever, chest pain, palpitations, orthopnea, PND, dizziness, syncope, cough, congestion, abdominal pain, hematochezia, melena, lower extremity edema.   Past Medical History:  Diagnosis Date  . Acute CVA (cerebrovascular accident) ([place]) 01/23/2015  . 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 Hyperlipidemia 01/22/2015  . TIA (transient ischemic attack) 01/22/2015    Past Surgical History:  Procedure Laterality Date  . CARDIAC CATHETERIZATION N/A 02/11/2015   Procedure: ASD Closure;  Surgeon: Sherren Mocha, MD;  Location: [place] [facility];  Service: Cardiovascular;  Laterality: N/A;  . TEE WITHOUT CARDIOVERSION N/A 01/24/2015   Procedure: TRANSESOPHAGEAL ECHOCARDIOGRAM (TEE);  Surgeon: Pixie Casino, MD;  Location: [facility] ENDOSCOPY;  Service: Cardiovascular;  Laterality: N/A;    Current Medications: Prior to Admission medications   Medication Sig Start Date End Date Taking? Authorizing Provider  atorvastatin (LIPITOR) 20 MG tablet Take 1 tablet (20 mg total) by mouth daily at 6 PM. Please schedule an appt ffor further refills !st attempt 07/26/18   Sherren Mocha, MD  CVS ASPIRIN ADULT LOW DOSE 81 MG chewable tablet CHEW 1 TABLET (81 MG TOTAL) BY MOUTH DAILY. 09/18/16   Sherren Mocha, MD  Multiple Vitamins-Minerals (MULTIVITAMIN WITH MINERALS) tablet Take 1 tablet by mouth daily.    [provider]  triamcinolone cream (KENALOG) 0.1 % Apply 1 application topically 2 (two) times daily. 07/06/17   Tiffany Alar, NP    Allergies:   Patient has no known allergies.   Social History   Socioeconomic History  . Marital status: Single    Spouse name: Not on file  . Number of children: Not on file  . Years of education: Not on file  . Highest education level: Not on file  Occupational History  . Not on file  Social Needs  . Financial resource strain: Not on file  . Food insecurity    Worry: Not on file    Inability: Not on file  . Transportation needs    Medical: Not on file    Non-medical: Not on file  Tobacco Use  . Smoking status: Never Smoker  . Smokeless tobacco: Never Used  Substance and Sexual Activity  . Alcohol use: Yes    Alcohol/week: 1.0 standard drinks    Types: 1 Glasses of wine per week    Comment: casual  . Drug use: Never  . Sexual activity: Yes    Partners: Male  Lifestyle  . Physical activity    Days per week: Not on file    Minutes per session: Not on file  . Stress: Not on file  Relationships  . Social Herbalist on phone: Not on file    Gets together: Not on file    Attends religious  service: Not on file    Active member of club or organization: Not on file    Attends meetings of clubs or organizations: Not on file    Relationship status: Not on file  Other Topics Concern  . 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   Completed college   Grew up in [place]- moved her in her mid 20's     Family History:  The patient's family history includes Cancer (age of onset: 60) in her father; Diabetes type I in her daughter.   ROS:   Please see the history of  present illness.    ROS All other systems reviewed and are negative.   PHYSICAL EXAM:   VS:  BP 122/80   Pulse 77   Ht 5\' 10"  (1.778 m)   Wt 199 lb (90.3 kg)   LMP 07/12/2015   SpO2 98%   BMI 28.55 kg/m    GEN: Well nourished, well developed, in no acute distress  HEENT: normal  Neck: no JVD, carotid bruits, or masses Cardiac: RRR; no murmurs, rubs, or gallops,no edema  Respiratory:  clear to auscultation bilaterally, normal work of breathing GI: soft, nontender, nondistended, + BS MS: no deformity or atrophy  Skin: warm and dry, no rash Neuro:  Alert and Oriented x 3, Strength and sensation are intact Psych: euthymic mood, full affect  Wt Readings from Last 3 Encounters:  11/22/18 199 lb (90.3 kg)  07/05/17 190 lb 3.2 oz (86.3 kg)  11/25/16 191 lb 12.8 oz (87 kg)      Studies/Labs Reviewed:   EKG:  EKG is ordered today.  The ekg ordered today demonstrates NSR  Recent Labs: No results found for requested labs within last 8760 hours.   Lipid Panel    Component Value Date/Time   CHOL 185 07/05/2017 1352   TRIG 155.0 (H) 07/05/2017 1352   HDL 49.00 07/05/2017 1352   CHOLHDL 4 07/05/2017 1352   VLDL 31.0 07/05/2017 1352   LDLCALC 105 (H) 07/05/2017 1352    Additional studies/ records that were reviewed today include:   Echocardiogram: 11/2016 Study Conclusions  - Left ventricle: The cavity size was normal. Wall thickness was   normal. Systolic function was normal. The estimated ejection   fraction was in the range of 55% to 60%. Wall motion was normal;   there were no regional wall motion abnormalities. There was no   evidence of elevated ventricular filling pressure by Doppler   parameters. - Mitral valve: There was trivial regurgitation. - Atrial septum: An Amplatzer closure device was present with no   shunt. - Tricuspid valve: There was mild regurgitation.  Impressions:  - Compared to the prior study, there has been no significant   interval  change.    ASSESSMENT & PLAN:    1. ASD s/p closure - She has chronic stable dyspnea with extreme exertion. Recovery is very quick. No further work up. Will consider echo during next visit.       Medication Adjustments/Labs and Tests Ordered: Current medicines are reviewed at length with the patient today.  Concerns regarding medicines are outlined above.  Medication changes, Labs and Tests ordered today are listed in the Patient Instructions below. Patient Instructions  Medication Instructions:  Your physician recommends that you continue on your current medications as  directed. Please refer to the Current Medication list given to you today.  If you need a refill on your cardiac medications before your next appointment, please call your pharmacy.   Lab work: None ordered  If you have labs (blood work) drawn today and your tests are completely normal, you will receive your results only by: Marland Kitchen MyChart Message (if you have MyChart) OR . A paper copy in the mail If you have any lab test that is abnormal or we need to change your treatment, we will call you to review the results.  Testing/Procedures: None ordered  Follow-Up: At [facility], you and your health needs are our priority.  As part of our continuing mission to provide you with exceptional heart care, we have created designated Provider Care Teams.  These Care Teams include your primary Cardiologist (physician) and Advanced Practice Providers (APPs -  Physician Assistants and Nurse Practitioners) who all work together to provide you with the care you need, when you need it. You will need a follow up appointment in:  6 months.  Please call our office 2 months in advance to schedule this appointment.  You may see Dr. Burt Gardner or one of the following Advanced Practice Providers on your designated Care Team: Richardson Dopp, PA-C [place], [place] . Daune Perch, NP  Any Other Special Instructions Will Be Listed Below (If  Applicable).       Jarrett Soho, [place]  11/22/2018 11:19 AM    [address], [place], [place]  [address] Phone: [phone]; Fax: [phone]

## 2018-11-22 ENCOUNTER — Ambulatory Visit (INDEPENDENT_AMBULATORY_CARE_PROVIDER_SITE_OTHER): Payer: BC Managed Care – PPO | Admitting: Physician Assistant

## 2018-11-22 ENCOUNTER — Encounter: Payer: Self-pay | Admitting: Physician Assistant

## 2018-11-22 ENCOUNTER — Other Ambulatory Visit: Payer: Self-pay

## 2018-11-22 VITALS — BP 122/80 | HR 77 | Ht 70.0 in | Wt 199.0 lb

## 2018-11-22 DIAGNOSIS — Z7189 Other specified counseling: Secondary | ICD-10-CM

## 2018-11-22 DIAGNOSIS — Q2111 Secundum atrial septal defect: Secondary | ICD-10-CM

## 2018-11-22 DIAGNOSIS — Q211 Atrial septal defect: Secondary | ICD-10-CM | POA: Diagnosis not present

## 2018-11-22 NOTE — Patient Instructions (Addendum)
Medication Instructions:  Your physician recommends that you continue on your current medications as directed. Please refer to the Current Medication list given to you today.  If you need a refill on your cardiac medications before your next appointment, please call your pharmacy.   Lab work: None ordered  If you have labs (blood work) drawn today and your tests are completely normal, you will receive your results only by: . MyChart Message (if you have MyChart) OR . A paper copy in the mail If you have any lab test that is abnormal or we need to change your treatment, we will call you to review the results.  Testing/Procedures: None ordered  Follow-Up: At [facility], you and your health needs are our priority.  As part of our continuing mission to provide you with exceptional heart care, we have created designated Provider Care Teams.  These Care Teams include your primary Cardiologist (physician) and Advanced Practice Providers (APPs -  Physician Assistants and Nurse Practitioners) who all work together to provide you with the care you need, when you need it. You will need a follow up appointment in:  12 months.  Please call our office 2 months in advance to schedule this appointment.  You may see Dr. Cooper or one of the following Advanced Practice Providers on your designated Care Team: Scott Weaver, PA-C Vin Bhagat, PA-C . Janine Hammond, NP  Any Other Special Instructions Will Be Listed Below (If Applicable).    

## 2019-01-03 ENCOUNTER — Other Ambulatory Visit: Payer: Self-pay | Admitting: Cardiovascular Disease

## 2019-05-11 ENCOUNTER — Ambulatory Visit: Payer: BC Managed Care – PPO | Attending: Internal Medicine

## 2019-05-11 DIAGNOSIS — Z23 Encounter for immunization: Secondary | ICD-10-CM

## 2019-05-11 NOTE — Progress Notes (Signed)
   Covid-19 Vaccination Clinic  Name:  Tiffany Gardner    MRN: [id number] DOB: 01-09-1971  05/11/2019  [name] was observed post Covid-19 immunization for 15 minutes without incident. She was provided with Vaccine Information Sheet and instruction to access the V-Safe system.   [name] was instructed to call 911 with any severe reactions post vaccine: Marland Kitchen Difficulty breathing  . Swelling of face and throat  . A fast heartbeat  . A bad rash all over body  . Dizziness and weakness   Immunizations Administered    Name Date Dose VIS Date Route   Pfizer COVID-19 Vaccine 05/11/2019 12:23 PM 0.3 mL 02/03/2019 Intramuscular   Manufacturer: [place]   Lot: [id number]   [place]: [id number]

## 2019-06-06 ENCOUNTER — Ambulatory Visit: Payer: BC Managed Care – PPO | Attending: Internal Medicine

## 2019-06-06 DIAGNOSIS — Z23 Encounter for immunization: Secondary | ICD-10-CM

## 2019-06-06 NOTE — Progress Notes (Signed)
   Covid-19 Vaccination Clinic  Name:  Virginie Rotar    MRN: [id number] DOB: Aug 21, 1970  06/06/2019  Ms. Mager was observed post Covid-19 immunization for 15 minutes without incident. She was provided with Vaccine Information Sheet and instruction to access the V-Safe system.   Ms. Abdellatif was instructed to call 911 with any severe reactions post vaccine: Marland Kitchen Difficulty breathing  . Swelling of face and throat  . A fast heartbeat  . A bad rash all over body  . Dizziness and weakness   Immunizations Administered    Name Date Dose VIS Date Route   Pfizer COVID-19 Vaccine 06/06/2019  8:28 AM 0.3 mL 02/03/2019 Intramuscular   Manufacturer: [place]   Lot: [id number]   [place]: [id number]

## 2019-11-14 ENCOUNTER — Encounter: Payer: Self-pay | Admitting: Family

## 2019-11-14 ENCOUNTER — Other Ambulatory Visit (HOSPITAL_BASED_OUTPATIENT_CLINIC_OR_DEPARTMENT_OTHER): Payer: Self-pay | Admitting: Family

## 2019-11-14 ENCOUNTER — Other Ambulatory Visit: Payer: Self-pay

## 2019-11-14 ENCOUNTER — Ambulatory Visit (INDEPENDENT_AMBULATORY_CARE_PROVIDER_SITE_OTHER): Payer: BC Managed Care – PPO | Admitting: Family

## 2019-11-14 VITALS — BP 130/68 | HR 75 | Temp 98.6°F | Resp 16 | Ht 70.0 in | Wt 199.2 lb

## 2019-11-14 DIAGNOSIS — Z0000 Encounter for general adult medical examination without abnormal findings: Secondary | ICD-10-CM | POA: Diagnosis not present

## 2019-11-14 DIAGNOSIS — E1169 Type 2 diabetes mellitus with other specified complication: Secondary | ICD-10-CM | POA: Diagnosis not present

## 2019-11-14 DIAGNOSIS — E785 Hyperlipidemia, unspecified: Secondary | ICD-10-CM | POA: Diagnosis not present

## 2019-11-14 DIAGNOSIS — Z1231 Encounter for screening mammogram for malignant neoplasm of breast: Secondary | ICD-10-CM

## 2019-11-14 LAB — LIPID PANEL
Cholesterol: 167 mg/dL (ref <200)
HDL: 44 mg/dL — ABNORMAL LOW (ref >=50)
LDL Cholesterol (Calc): 102 mg/dL (calc) — ABNORMAL HIGH
Non-HDL Cholesterol (Calc): 123 mg/dL (calc) (ref <130)
Total CHOL/HDL Ratio: 3.8 (calc) (ref <5.0)
Triglycerides: 118 mg/dL (ref <150)

## 2019-11-14 LAB — COMPREHENSIVE METABOLIC PANEL
AG Ratio: 1.7 (calc) (ref 1.0–2.5)
ALT: 19 U/L (ref 6–29)
AST: 19 U/L (ref 10–35)
Albumin: 4.4 g/dL (ref 3.6–5.1)
Alkaline phosphatase (APISO): 110 U/L (ref 31–125)
BUN: 16 mg/dL (ref 7–25)
CO2: 27 mmol/L (ref 20–32)
Calcium: 9.6 mg/dL (ref 8.6–10.2)
Chloride: 104 mmol/L (ref 98–110)
Creat: 1.02 mg/dL (ref 0.50–1.10)
Globulin: 2.6 g/dL (calc) (ref 1.9–3.7)
Glucose, Bld: 104 mg/dL — ABNORMAL HIGH (ref 65–99)
Potassium: 4.2 mmol/L (ref 3.5–5.3)
Sodium: 139 mmol/L (ref 135–146)
Total Bilirubin: 1.3 mg/dL — ABNORMAL HIGH (ref 0.2–1.2)
Total Protein: 7 g/dL (ref 6.1–8.1)

## 2019-11-14 LAB — EXTRA LAV TOP TUBE

## 2019-11-14 NOTE — Progress Notes (Signed)
Subjective:    Patient ID: Tiffany Gardner, female    DOB: 1970/11/19, 49 y.o.   MRN: [id number]  HPI  Patient presents today for complete physical.  Immunizations: Declines flu shot today. Will get later in October, feels like tetanus 6-65yrs ago Diet: fair Exercise: some walking (5-6 days a week for 4 miles each time) Wt Readings from Last 3 Encounters:  11/14/19 199 lb 3.2 oz (90.4 kg)  11/22/18 199 lb (90.3 kg)  07/05/17 190 lb 3.2 oz (86.3 kg)  Colonoscopy:wants to wait until 50 Pap Smear: will complete with GYN.  Mammogram: due Dental: up to date Vision: due     Review of Systems  Constitutional: Negative for unexpected weight change.  HENT: Negative for hearing loss and rhinorrhea.   Eyes: Negative for visual disturbance.  Respiratory: Negative for cough and shortness of breath.   Cardiovascular: Negative for chest pain.  Gastrointestinal: Negative for blood in stool, constipation and diarrhea.  Genitourinary: Negative for dysuria, frequency and hematuria.  Musculoskeletal: Negative for arthralgias and myalgias.  Skin: Negative for rash.  Neurological: Negative for headaches.  Hematological: Negative for adenopathy.  Psychiatric/Behavioral:       Denies depression/anxiety   Past Medical History:  Diagnosis Date  . Acute CVA (cerebrovascular accident) ([place]) 01/23/2015  . 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 Hyperlipidemia 01/22/2015  . TIA (transient ischemic attack) 01/22/2015     Social History   Socioeconomic History  . Marital status: Single    Spouse name: Not on file  . Number of children: Not on file  . Years of education: Not on file  . Highest education level: Not on file  Occupational History  . Not on file  Tobacco Use  . Smoking status: Never Smoker  . Smokeless tobacco: Never Used  Vaping Use  . Vaping Use: Never used  Substance and Sexual Activity  . Alcohol use: Yes    Alcohol/week: 1.0 standard drink    Types: 1 Glasses of wine per week     Comment: casual  . Drug use: Never  . Sexual activity: Yes    Partners: Male  Other Topics Concern  . Not on file  Social History Narrative   Works as an Solicitor in Retail   2 children (both daughters) 25 and 71   Divorced   Dog and a Neurosurgeon   Enjoys cayaking, hiking, reading/cooking.   Completed college   Grew up in [place]- moved her in her mid 20's   Social Determinants of Health   Financial Resource Strain:   . Difficulty of Paying Living Expenses: Not on file  Food Insecurity:   . Worried About Charity fundraiser in the Last Year: Not on file  . Ran Out of Food in the Last Year: Not on file  Transportation Needs:   . Lack of Transportation (Medical): Not on file  . Lack of Transportation (Non-Medical): Not on file  Physical Activity:   . Days of Exercise per Week: Not on file  . Minutes of Exercise per Session: Not on file  Stress:   . Feeling of Stress : Not on file  Social Connections:   . Frequency of Communication with Friends and Family: Not on file  . Frequency of Social Gatherings with Friends and Family: Not on file  . Attends Religious Services: Not on file  . Active Member of Clubs or Organizations: Not on file  . Attends Archivist Meetings: Not on file  . Marital  Status: Not on file  Intimate Partner Violence:   . Fear of Current or Ex-Partner: Not on file  . Emotionally Abused: Not on file  . Physically Abused: Not on file  . Sexually Abused: Not on file    Past Surgical History:  Procedure Laterality Date  . CARDIAC CATHETERIZATION N/A 02/11/2015   Procedure: ASD Closure;  Surgeon: Sherren Mocha, MD;  Location: [facility];  Service: Cardiovascular;  Laterality: N/A;  . TEE WITHOUT CARDIOVERSION N/A 01/24/2015   Procedure: TRANSESOPHAGEAL ECHOCARDIOGRAM (TEE);  Surgeon: Pixie Casino, MD;  [facility] END[facility];  Service: Cardiovascular;  Laterality: N/A;    Family History  Problem Relation Age of Onset  . Cancer Father 50        throat CA/smoker  . Diabetes type I Daughter     No Known Allergies  Current Outpatient Medications on File Prior to Visit  Medication Sig Dispense Refill  . atorvastatin (LIPITOR) 20 MG tablet TAKE 1 TABLET BY MOUTH AT 6PM 90 tablet 3  . CVS ASPIRIN ADULT LOW DOSE 81 MG chewable tablet CHEW 1 TABLET (81 MG TOTAL) BY MOUTH DAILY. 30 tablet 0  . Multiple Vitamins-Minerals (MULTIVITAMIN WITH MINERALS) tablet Take 1 tablet by mouth daily.     No current facility-administered medications on file prior to visit.    BP 130/68 (BP Location: Right Arm, Patient Position: Sitting, Cuff Size: Large)   Pulse 75   Temp 98.6 F (37 C) (Oral)   Resp 16   Ht 5\' 10"  (1.778 m)   Wt 199 lb 3.2 oz (90.4 kg)   LMP 07/12/2015   SpO2 100%   BMI 28.58 kg/m        Objective:   Physical Exam  Physical Exam  Constitutional: She is oriented to person, place, and time. She appears well-developed and well-nourished. No distress.  HENT:  Head: Normocephalic and atraumatic.  Right Ear: Tympanic membrane and ear canal normal.  Left Ear: Tympanic membrane and ear canal normal.  Mouth/Throat: not examined- patient wearing mask Eyes: Pupils are equal, round, and reactive to light. No scleral icterus.  Neck: Normal range of motion. No thyromegaly present.  Cardiovascular: Normal rate and regular rhythm.   No murmur heard. Pulmonary/Chest: Effort normal and breath sounds normal. No respiratory distress. He has no wheezes. She has no rales. She exhibits no tenderness.  Abdominal: Soft. Bowel sounds are normal. She exhibits no distension and no mass. There is no tenderness. There is no rebound and no guarding.  Musculoskeletal: She exhibits no edema.  Lymphadenopathy:    She has no cervical adenopathy.  Neurological: She is alert and oriented to person, place, and time. She has normal patellar reflexes. She exhibits normal muscle tone. Coordination normal.  Skin: Skin is warm and dry.  Psychiatric:  She has a normal mood and affect. Her behavior is normal. Judgment and thought content normal.  Breasts: Examined lying Right: Without masses, retractions, discharge or axillary adenopathy.  Left: Without masses, retractions, discharge or axillary adenopathy.       Assessment & Plan:   Preventative care- discussed healthy diet, exercise.  She will follow up with GYN for pap.  Ordered mammogram on the first floor. Obtain labs as ordered.  This visit occurred during the SARS-CoV-2 public health emergency.  Safety protocols were in place, including screening questions prior to the visit, additional usage of staff PPE, and extensive cleaning of exam room while observing appropriate contact time as indicated for disinfecting solutions.  Assessment & Plan:

## 2019-11-14 NOTE — Patient Instructions (Addendum)
Please schedule a routine eye exam.  Continue your regular walking routine.  Preventive Care 47-49 Years Old, Female Preventive care refers to visits with your health care provider and lifestyle choices that can promote health and wellness. This includes:  A yearly physical exam. This may also be called an annual well check.  Regular dental visits and eye exams.  Immunizations.  Screening for certain conditions.  Healthy lifestyle choices, such as eating a healthy diet, getting regular exercise, not using drugs or products that contain nicotine and tobacco, and limiting alcohol use. What can I expect for my preventive care visit? Physical exam Your health care provider will check your:  Height and weight. This may be used to calculate body mass index (BMI), which tells if you are at a healthy weight.  Heart rate and blood pressure.  Skin for abnormal spots. Counseling Your health care provider may ask you questions about your:  Alcohol, tobacco, and drug use.  Emotional well-being.  Home and relationship well-being.  Sexual activity.  Eating habits.  Work and work Statistician.  Method of birth control.  Menstrual cycle.  Pregnancy history. What immunizations do I need?  Influenza (flu) vaccine  This is recommended every year. Tetanus, diphtheria, and pertussis (Tdap) vaccine  You may need a Td booster every 10 years. Varicella (chickenpox) vaccine  You may need this if you have not been vaccinated. Zoster (shingles) vaccine  You may need this after age 57. Measles, mumps, and rubella (MMR) vaccine  You may need at least one dose of MMR if you were born in 1957 or later. You may also need a second dose. Pneumococcal conjugate (PCV13) vaccine  You may need this if you have certain conditions and were not previously vaccinated. Pneumococcal polysaccharide (PPSV23) vaccine  You may need one or two doses if you smoke cigarettes or if you have certain  conditions. Meningococcal conjugate (MenACWY) vaccine  You may need this if you have certain conditions. Hepatitis A vaccine  You may need this if you have certain conditions or if you travel or work in places where you may be exposed to hepatitis A. Hepatitis B vaccine  You may need this if you have certain conditions or if you travel or work in places where you may be exposed to hepatitis B. Haemophilus influenzae type b (Hib) vaccine  You may need this if you have certain conditions. Human papillomavirus (HPV) vaccine  If recommended by your health care provider, you may need three doses over 6 months. You may receive vaccines as individual doses or as more than one vaccine together in one shot (combination vaccines). Talk with your health care provider about the risks and benefits of combination vaccines. What tests do I need? Blood tests  Lipid and cholesterol levels. These may be checked every 5 years, or more frequently if you are over 67 years old.  Hepatitis C test.  Hepatitis B test. Screening  Lung cancer screening. You may have this screening every year starting at age 44 if you have a 30-pack-year history of smoking and currently smoke or have quit within the past 15 years.  Colorectal cancer screening. All adults should have this screening starting at age 51 and continuing until age 28. Your health care provider may recommend screening at age 33 if you are at increased risk. You will have tests every 1-10 years, depending on your results and the type of screening test.  Diabetes screening. This is done by checking your blood sugar (glucose)  after you have not eaten for a while (fasting). You may have this done every 1-3 years.  Mammogram. This may be done every 1-2 years. Talk with your health care provider about when you should start having regular mammograms. This may depend on whether you have a family history of breast cancer.  BRCA-related cancer screening. This  may be done if you have a family history of breast, ovarian, tubal, or peritoneal cancers.  Pelvic exam and Pap test. This may be done every 3 years starting at age 32. Starting at age 31, this may be done every 5 years if you have a Pap test in combination with an HPV test. Other tests  Sexually transmitted disease (STD) testing.  Bone density scan. This is done to screen for osteoporosis. You may have this scan if you are at high risk for osteoporosis. Follow these instructions at home: Eating and drinking  Eat a diet that includes fresh fruits and vegetables, whole grains, lean protein, and low-fat dairy.  Take vitamin and mineral supplements as recommended by your health care provider.  Do not drink alcohol if: ? Your health care provider tells you not to drink. ? You are pregnant, may be pregnant, or are planning to become pregnant.  If you drink alcohol: ? Limit how much you have to 0-1 drink a day. ? Be aware of how much alcohol is in your drink. In the [place] one drink equals one 12 oz bottle of beer (355 mL), one 5 oz glass of wine (148 mL), or one 1 oz glass of hard liquor (44 mL). Lifestyle  Take daily care of your teeth and gums.  Stay active. Exercise for at least 30 minutes on 5 or more days each week.  Do not use any products that contain nicotine or tobacco, such as cigarettes, e-cigarettes, and chewing tobacco. If you need help quitting, ask your health care provider.  If you are sexually active, practice safe sex. Use a condom or other form of birth control (contraception) in order to prevent pregnancy and STIs (sexually transmitted infections).  If told by your health care provider, take low-dose aspirin daily starting at age 77. What's next?  Visit your health care provider once a year for a well check visit.  Ask your health care provider how often you should have your eyes and teeth checked.  Stay up to date on all vaccines. This information is not  intended to replace advice given to you by your health care provider. Make sure you discuss any questions you have with your health care provider. Document Revised: 10/21/2017 Document Reviewed: 10/21/2017 Elsevier Patient Education  2020 [employer].

## 2019-11-15 ENCOUNTER — Other Ambulatory Visit: Payer: Self-pay | Admitting: Family

## 2019-11-15 DIAGNOSIS — Z1231 Encounter for screening mammogram for malignant neoplasm of breast: Secondary | ICD-10-CM

## 2019-12-01 ENCOUNTER — Other Ambulatory Visit: Payer: Self-pay

## 2019-12-01 ENCOUNTER — Ambulatory Visit
Admission: RE | Admit: 2019-12-01 | Discharge: 2019-12-01 | Disposition: A | Discharge status: Home / Self Care | Payer: BC Managed Care – PPO | Source: Ambulatory Visit

## 2019-12-01 DIAGNOSIS — Z1231 Encounter for screening mammogram for malignant neoplasm of breast: Secondary | ICD-10-CM

## 2020-02-26 ENCOUNTER — Ambulatory Visit: Payer: BC Managed Care – PPO | Admitting: Cardiovascular Disease

## 2020-04-09 ENCOUNTER — Other Ambulatory Visit: Payer: Self-pay | Admitting: Cardiovascular Disease

## 2020-04-29 NOTE — Progress Notes (Signed)
Cardiology Office Note:    Date:  04/30/2020   ID:  Tiffany Gardner, DOB September 17, 1970, MRN [id number]  PCP:  [name], NP   [facility]  Cardiologist:  Sherren Mocha, MD   Advanced Practice Provider:  No care team member to display Electrophysiologist:  None       Referring MD: [name], NP   Chief Complaint:  Follow-up (Hx of ASD closure )    Patient Profile:    Tiffany Gardner is a 50 y.o. 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  She returns for f/u.  She is here alone.  Overall, she is doing well without chest discomfort, syncope, orthopnea, leg edema.  She does have mild shortness of breath with more extreme exertion.  This is unchanged.  She enjoys hiking and going for walks.      Past Medical History:  Diagnosis Date  . Acute CVA (cerebrovascular accident) ([place]) 01/23/2015  . 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 Hyperlipidemia 01/22/2015  . TIA (transient ischemic attack) 01/22/2015    Current Medications: Current Meds  Medication Sig  . CVS ASPIRIN ADULT LOW DOSE 81 MG chewable tablet CHEW 1 TABLET (81 MG TOTAL) BY MOUTH DAILY.  . Multiple Vitamins-Minerals (MULTIVITAMIN WITH MINERALS) tablet Take 1 tablet by mouth daily.  . rosuvastatin (CRESTOR) 10 MG tablet Take 1 tablet (10 mg total) by mouth daily.  . [DISCONTINUED] atorvastatin (LIPITOR) 20 MG tablet TAKE 1 TABLET BY MOUTH AT 6PM     Allergies:   Patient has no known allergies.   Social History   Tobacco Use  . Smoking status: Never Smoker  . Smokeless tobacco: Never Used  Vaping Use  . Vaping Use: Never used  Substance Use Topics  . Alcohol use: Yes    Alcohol/week: 1.0 standard drink     Types: 1 Glasses of wine per week    Comment: casual  . Drug use: Never     Family Hx: The patient's family history includes Cancer (age of onset: 74) in her father; Diabetes type I in her daughter.  ROS   EKGs/Labs/Other Test Reviewed:    EKG:  EKG is   ordered today.  The ekg ordered today demonstrates normal sinus rhythm, heart rate 70, normal axis, no ST-T wave changes, QTC 462  Recent Labs: 11/14/2019: ALT 19; BUN 16; Creat 1.02; Potassium 4.2; Sodium 139   Recent Lipid Panel Lab Results  Component Value Date/Time   CHOL 167 11/14/2019 10:08 AM   TRIG 118 11/14/2019 10:08 AM   HDL 44 (L) 11/14/2019 10:08 AM   CHOLHDL 3.8 11/14/2019 10:08 AM   LDLCALC 102 (H) 11/14/2019 10:08 AM      Risk Assessment/Calculations:      Physical Exam:    VS:  BP 110/70   Pulse 70   Ht 5' 10" (1.778 m)   Wt 195 lb 6.4 oz (88.6 kg)   LMP 07/12/2015   SpO2 95%   BMI 28.04 kg/m     Wt Readings from Last 3 Encounters:  04/30/20 195 lb 6.4 oz (88.6 kg)  11/14/19 199 lb 3.2 oz (90.4 kg)  11/22/18 199 lb (90.3 kg)  Constitutional:      Appearance: Healthy appearance. Not in distress.  Neck:     Vascular: No JVR. JVD normal.  Pulmonary:     Effort: Pulmonary effort is normal.     Breath sounds: No wheezing. No rales.  Cardiovascular:     Normal rate. Regular rhythm. Normal S1. Normal S2.     Murmurs: There is no murmur.  Edema:    Peripheral edema absent.  Abdominal:     Palpations: Abdomen is soft.  Skin:    General: Skin is warm and dry.  Neurological:     Mental Status: Alert and oriented to person, place and time.     Cranial Nerves: Cranial nerves are intact.       ASSESSMENT & PLAN:    1. ASD (atrial septal defect), ostium secundum Hx of CVA.  S/p closure.  Echocardiogram in 2018 without shunt.    2. Mixed hyperlipidemia She has a lot of myalgias and muscle weakness related to statin Rx.  She is also concerned about her sugars.  I will stop her  Atorvastatin.  She should remain off of statin Rx for 6 weeks, then start Rosuvastatin 10 mg once daily.  If she has recurrent symptoms with this, we can look into alternative Rx.  Plan fasting CMET, Lipids after 3 mos of Rx with Rosuvastatin.        Dispo:  Return in about 1 year (around 04/30/2021) for Routine Follow Up, w/ Dr. Cooper, in person.   Medication Adjustments/Labs and Tests Ordered: Current medicines are reviewed at length with the patient today.  Concerns regarding medicines are outlined above.  Tests Ordered: Orders Placed This Encounter  Procedures  . Comp Met (CMET)  . Lipid Profile  . EKG 12-Lead   Medication Changes: Meds ordered this encounter  Medications  . rosuvastatin (CRESTOR) 10 MG tablet    Sig: Take 1 tablet (10 mg total) by mouth daily.    Dispense:  90 tablet    Refill:  3    Signed, Scott Weaver, PA-C  04/30/2020 10:39 AM    [facility] [address], , [place]  [address] Phone: [phone]; Fax: [phone]    

## 2020-04-30 ENCOUNTER — Ambulatory Visit (INDEPENDENT_AMBULATORY_CARE_PROVIDER_SITE_OTHER): Payer: BC Managed Care – PPO | Admitting: Physician Assistant

## 2020-04-30 ENCOUNTER — Encounter: Payer: Self-pay | Admitting: Physician Assistant

## 2020-04-30 ENCOUNTER — Other Ambulatory Visit: Payer: Self-pay

## 2020-04-30 VITALS — BP 110/70 | HR 70 | Ht 70.0 in | Wt 195.4 lb

## 2020-04-30 DIAGNOSIS — T466X5A Adverse effect of antihyperlipidemic and antiarteriosclerotic drugs, initial encounter: Secondary | ICD-10-CM

## 2020-04-30 DIAGNOSIS — E782 Mixed hyperlipidemia: Secondary | ICD-10-CM

## 2020-04-30 DIAGNOSIS — M791 Myalgia, unspecified site: Secondary | ICD-10-CM | POA: Diagnosis not present

## 2020-04-30 DIAGNOSIS — Q2111 Secundum atrial septal defect: Secondary | ICD-10-CM

## 2020-04-30 DIAGNOSIS — Q211 Atrial septal defect: Secondary | ICD-10-CM

## 2020-04-30 MED ORDER — ROSUVASTATIN CALCIUM 10 MG PO TABS: 10.0000 mg | ORAL_TABLET | Freq: Every day | ORAL | 3 refills | Status: DC

## 2020-04-30 NOTE — Patient Instructions (Signed)
Medication Instructions:  Your physician has recommended you make the following change in your medication: 1. Discontinue Lipitor 2.  Wait 4-6 weeks after discontinuing Lipitor to start new statin. 3. After 4-6 weeks start rosuvastatin one tablet my mouth ( 10 mg) daily, sent in # 90 to requested pharmacy.   *If you need a refill on your cardiac medications before your next appointment, please call your pharmacy*   Lab Work: Your physician recommends that you return for a FASTING lipid profile/CMET: July 12 any time between 7:30 and 4:30 fasting after 12 from the night before.   If you have labs (blood work) drawn today and your tests are completely normal, you will receive your results only by: Marland Kitchen MyChart Message (if you have MyChart) OR . A paper copy in the mail If you have any lab test that is abnormal or we need to change your treatment, we will call you to review the results.   Testing/Procedures: -None   Follow-Up: At [facility], you and your health needs are our priority.  As part of our continuing mission to provide you with exceptional heart care, we have created designated Provider Care Teams.  These Care Teams include your primary Cardiologist (physician) and Advanced Practice Providers (APPs -  Physician Assistants and Nurse Practitioners) who all work together to provide you with the care you need, when you need it.  We recommend signing up for the patient portal called "MyChart".  Sign up information is provided on this After Visit Summary.  MyChart is used to connect with patients for Virtual Visits (Telemedicine).  Patients are able to view lab/test results, encounter notes, upcoming appointments, etc.  Non-urgent messages can be sent to your provider as well.   To learn more about what you can do with MyChart, go to [email or web address].    Your next appointment:   1 year(s)  The format for your next appointment:   In Person  Provider:   Sherren Mocha,  MD   Other Instructions Call or send mychart message if you experience any aches or weakness from the new statin. (Rosuvastatin).

## 2020-07-07 ENCOUNTER — Other Ambulatory Visit: Payer: Self-pay | Admitting: Cardiovascular Disease

## 2020-09-03 ENCOUNTER — Other Ambulatory Visit: Payer: BC Managed Care – PPO | Admitting: *Deleted

## 2020-09-03 ENCOUNTER — Other Ambulatory Visit: Payer: Self-pay

## 2020-09-03 DIAGNOSIS — T466X5A Adverse effect of antihyperlipidemic and antiarteriosclerotic drugs, initial encounter: Secondary | ICD-10-CM | POA: Diagnosis not present

## 2020-09-03 DIAGNOSIS — M791 Myalgia, unspecified site: Secondary | ICD-10-CM

## 2020-09-03 DIAGNOSIS — E782 Mixed hyperlipidemia: Secondary | ICD-10-CM

## 2020-09-03 DIAGNOSIS — Q211 Atrial septal defect: Secondary | ICD-10-CM

## 2020-09-03 DIAGNOSIS — Q2111 Secundum atrial septal defect: Secondary | ICD-10-CM

## 2020-09-03 LAB — COMPREHENSIVE METABOLIC PANEL
ALT: 21 IU/L (ref 0–32)
AST: 19 IU/L (ref 0–40)
Albumin/Globulin Ratio: 1.7 (ref 1.2–2.2)
Albumin: 4.3 g/dL (ref 3.8–4.8)
Alkaline Phosphatase: 112 IU/L (ref 44–121)
BUN/Creatinine Ratio: 14 (ref 9–23)
BUN: 12 mg/dL (ref 6–24)
Bilirubin Total: 0.6 mg/dL (ref 0.0–1.2)
CO2: 19 mmol/L — ABNORMAL LOW (ref 20–29)
Calcium: 9.4 mg/dL (ref 8.7–10.2)
Chloride: 107 mmol/L — ABNORMAL HIGH (ref 96–106)
Creatinine, Ser: 0.85 mg/dL (ref 0.57–1.00)
Globulin, Total: 2.5 g/dL (ref 1.5–4.5)
Glucose: 103 mg/dL — ABNORMAL HIGH (ref 65–99)
Potassium: 4.5 mmol/L (ref 3.5–5.2)
Sodium: 140 mmol/L (ref 134–144)
Total Protein: 6.8 g/dL (ref 6.0–8.5)
eGFR: 83 mL/min/{1.73_m2} (ref >59)

## 2020-09-03 LAB — LIPID PANEL
Chol/HDL Ratio: 4.6 ratio — ABNORMAL HIGH (ref 0.0–4.4)
Cholesterol, Total: 189 mg/dL (ref 100–199)
HDL: 41 mg/dL (ref >39)
LDL Chol Calc (NIH): 112 mg/dL — ABNORMAL HIGH (ref 0–99)
Triglycerides: 204 mg/dL — ABNORMAL HIGH (ref 0–149)
VLDL Cholesterol Cal: 36 mg/dL (ref 5–40)

## 2020-09-04 ENCOUNTER — Other Ambulatory Visit: Payer: Self-pay | Admitting: *Deleted

## 2020-09-04 DIAGNOSIS — E782 Mixed hyperlipidemia: Secondary | ICD-10-CM

## 2020-09-04 MED ORDER — EZETIMIBE 10 MG PO TABS: 10.0000 mg | ORAL_TABLET | Freq: Every day | ORAL | 3 refills | Status: DC

## 2020-09-14 DIAGNOSIS — Z20822 Contact with and (suspected) exposure to covid-19: Secondary | ICD-10-CM | POA: Diagnosis not present

## 2020-09-16 ENCOUNTER — Encounter: Payer: Self-pay | Admitting: Family

## 2020-12-11 ENCOUNTER — Other Ambulatory Visit: Payer: BC Managed Care – PPO | Admitting: *Deleted

## 2020-12-11 ENCOUNTER — Other Ambulatory Visit: Payer: Self-pay

## 2020-12-11 DIAGNOSIS — E782 Mixed hyperlipidemia: Secondary | ICD-10-CM | POA: Diagnosis not present

## 2020-12-11 LAB — LIPID PANEL
Chol/HDL Ratio: 3.8 ratio (ref 0.0–4.4)
Cholesterol, Total: 160 mg/dL (ref 100–199)
HDL: 42 mg/dL (ref >39)
LDL Chol Calc (NIH): 92 mg/dL (ref 0–99)
Triglycerides: 151 mg/dL — ABNORMAL HIGH (ref 0–149)
VLDL Cholesterol Cal: 26 mg/dL (ref 5–40)

## 2020-12-11 LAB — HEPATIC FUNCTION PANEL
ALT: 22 IU/L (ref 0–32)
AST: 20 IU/L (ref 0–40)
Albumin: 4.2 g/dL (ref 3.8–4.8)
Alkaline Phosphatase: 100 IU/L (ref 44–121)
Bilirubin Total: 0.6 mg/dL (ref 0.0–1.2)
Bilirubin, Direct: 0.17 mg/dL (ref 0.00–0.40)
Total Protein: 6.7 g/dL (ref 6.0–8.5)

## 2021-05-06 ENCOUNTER — Ambulatory Visit (INDEPENDENT_AMBULATORY_CARE_PROVIDER_SITE_OTHER): Payer: BC Managed Care – PPO | Admitting: Family

## 2021-05-06 ENCOUNTER — Encounter: Payer: Self-pay | Admitting: Family

## 2021-05-06 VITALS — BP 119/65 | HR 62 | Temp 98.7°F | Resp 16 | Ht 70.0 in | Wt 197.0 lb

## 2021-05-06 DIAGNOSIS — R739 Hyperglycemia, unspecified: Secondary | ICD-10-CM

## 2021-05-06 DIAGNOSIS — Z1211 Encounter for screening for malignant neoplasm of colon: Secondary | ICD-10-CM

## 2021-05-06 DIAGNOSIS — E782 Mixed hyperlipidemia: Secondary | ICD-10-CM

## 2021-05-06 DIAGNOSIS — Z0000 Encounter for general adult medical examination without abnormal findings: Secondary | ICD-10-CM | POA: Diagnosis not present

## 2021-05-06 LAB — COMPREHENSIVE METABOLIC PANEL
ALT: 21 U/L (ref 0–35)
AST: 18 U/L (ref 0–37)
Albumin: 4.5 g/dL (ref 3.5–5.2)
Alkaline Phosphatase: 85 U/L (ref 39–117)
BUN: 13 mg/dL (ref 6–23)
CO2: 27 mEq/L (ref 19–32)
Calcium: 9.4 mg/dL (ref 8.4–10.5)
Chloride: 105 mEq/L (ref 96–112)
Creatinine, Ser: 0.81 mg/dL (ref 0.40–1.20)
GFR: 84.33 mL/min (ref >60.00)
Glucose, Bld: 82 mg/dL (ref 70–99)
Potassium: 4.5 mEq/L (ref 3.5–5.1)
Sodium: 138 mEq/L (ref 135–145)
Total Bilirubin: 0.9 mg/dL (ref 0.2–1.2)
Total Protein: 6.7 g/dL (ref 6.0–8.3)

## 2021-05-06 LAB — HEMOGLOBIN A1C: Hgb A1c MFr Bld: 6.1 % (ref 4.6–6.5)

## 2021-05-06 NOTE — Assessment & Plan Note (Addendum)
Wt Readings from Last 3 Encounters:  ?05/06/21 197 lb (89.4 kg)  ?04/30/20 195 lb 6.4 oz (88.6 kg)  ?11/14/19 199 lb 3.2 oz (90.4 kg)  ? ?Continue healthy diet, exercise. Refer for mammo/colo. Encouraged pt to get the bivalent covid booster. She declines shingrix today and wishes to think about it. She states last pap was 2 yrs ago in [place].  She plans to repeat pap with her GYN in [place] in 1 year. Was reportedly normal. ?

## 2021-05-06 NOTE — Patient Instructions (Signed)
Please complete lab work prior to leaving. ?Continue your work on healthy diet and regular exercise.  ?

## 2021-05-06 NOTE — Progress Notes (Signed)
? ?Subjective:  ? ?By signing my name below, I, Tiffany Gardner, attest that this documentation has been prepared under the direction and in the presence of Tiffany Alar NP, 05/06/2021   ? ? Patient ID: Tiffany Gardner, female    DOB: Mar 04, 1970, 51 y.o.   MRN: [id number] ? ?Chief Complaint  ?Patient presents with  ? Annual Exam  ?   ?  ? ? ?HPI ?Patient is in today for a comprehensive physical exam. ? ?Rosuvastatin - Patient complains of leg pain and fatigue. She believes her symptoms could be due to the medication. Cardiology has been working with her on this and she notes crestor has the least pain associated with it.  ? ?Blood Sugar - Her blood sugar levels are elevated. She reports no family history of diabetes. ?Lab Results  ?Component Value Date  ? HGBA1C 6.1 (H) 01/23/2015  ? ?She denies having any fever, ear pain,  joint pain, new moles, congestion, sinus pain, sore throat, palpations, wheezing, n/v/d, constipation, blood in stool, dysuria, frequency, hematuria at this time. ? ?Social History: She reported no change in social and family history. ?Colonoscopy: She is overdue for a colonoscopy but is interested in receiving one. ?Pap Smear: Last completed internationally in 2021.  ?Mammogram: Last completed 12/01/2019. She is interested in doing an upgraded mammogram. ?Immunizations: She is not interested in receiving the flu vaccine. She is considering taking the bivalent COVID - 19 vaccination and shingles vaccination. She is not interested in receiving the Hepatitis C screening.  ?Diet / Exercise: She is doing well with dieting. She regularly exercises by doing yoga. ?Dental / Vision: She is UTD with vision and dental exams.  ? ?Health Maintenance Due  ?Topic Date Due  ? URINE MICROALBUMIN  Never done  ? COLONOSCOPY (Pts 45-95yr Insurance coverage will need to be confirmed)  Never done  ? PAP SMEAR-Modifier  07/25/2018  ? COVID-19 Vaccine (3 - Booster for Pfizer series) 08/01/2019  ? Zoster Vaccines-  Shingrix (1 of 2) Never done  ? MAMMOGRAM  11/30/2020  ? ? ?Past Medical History:  ?Diagnosis Date  ? Acute CVA (cerebrovascular accident) ([facility] 01/23/2015  ? Acute embolic stroke ([facility]   ? Hyperlipidemia 01/22/2015  ? TIA (transient ischemic attack) 01/22/2015  ? ? ?Past Surgical History:  ?Procedure Laterality Date  ? CARDIAC CATHETERIZATION N/A 02/11/2015  ? Procedure: ASD Closure;  Surgeon: MSherren Mocha MD;  [facility];  Service: Cardiovascular;  Laterality: N/A;  ? TEE WITHOUT CARDIOVERSION N/A 01/24/2015  ? Procedure: TRANSESOPHAGEAL ECHOCARDIOGRAM (TEE);  Surgeon: KPixie Casino MD;  [facility]  Service: Cardiovascular;  Laterality: N/A;  ? ? ?Family History  ?Problem Relation Age of Onset  ? Cancer Father 516 ?     throat CA/smoker  ? Diabetes type I Daughter   ? ? ?Social History  ? ?Socioeconomic History  ? Marital status: Single  ?  Spouse name: Not on file  ? Number of children: Not on file  ? Years of education: Not on file  ? Highest education level: Not on file  ?Occupational History  ? Not on file  ?Tobacco Use  ? Smoking status: Never  ? Smokeless tobacco: Never  ?Vaping Use  ? Vaping Use: Never used  ?Substance and Sexual Activity  ? Alcohol use: Yes  ?  Alcohol/week: 1.0 standard drink  ?  Types: 1 Glasses of wine per week  ?  Comment: casual  ? Drug use: Never  ? Sexual activity:  Yes  ?  Partners: Male  ?Other Topics Concern  ? Not on file  ?Social History Narrative  ? 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 2 children (both daughters) 71 and 59  ? Divorced  ? Dog and a cat  ? Enjoys cayaking, hiking, reading/cooking.  ? Completed college  ? Grew up in [place]- moved her in her mid 20's  ? ?Social Determinants of Health  ? ?Financial Resource Strain: Not on file  ?Food Insecurity: Not on file  ?Transportation Needs: Not on file  ?Physical Activity: Not on file  ?Stress: Not on file  ?Social Connections: Not on file  ?Intimate Partner Violence: Not on file   ? ? ?Outpatient Medications Prior to Visit  ?Medication Sig Dispense Refill  ? CVS ASPIRIN ADULT LOW DOSE 81 MG chewable tablet CHEW 1 TABLET (81 MG TOTAL) BY MOUTH DAILY. 30 tablet 0  ? ezetimibe (ZETIA) 10 MG tablet Take 1 tablet (10 mg total) by mouth daily. 90 tablet 3  ? Multiple Vitamins-Minerals (MULTIVITAMIN WITH MINERALS) tablet Take 1 tablet by mouth daily.    ? rosuvastatin (CRESTOR) 10 MG tablet Take 1 tablet (10 mg total) by mouth daily. 90 tablet 3  ? ?No facility-administered medications prior to visit.  ? ? ?No Known Allergies ? ?Review of Systems  ?Constitutional:  Positive for malaise/fatigue. Negative for fever.  ?HENT:  Negative for congestion, ear pain, sinus pain and sore throat.   ?Respiratory:  Negative for wheezing.   ?Cardiovascular:  Negative for palpitations.  ?Gastrointestinal:  Negative for blood in stool, constipation, diarrhea, nausea and vomiting.  ?Genitourinary:  Negative for dysuria, frequency and hematuria.  ?Musculoskeletal:  Positive for myalgias. Negative for joint pain.  ?     (+) Leg Pain  ?Skin:   ?     (-) New Moles  ?Psychiatric/Behavioral:  Negative for depression. The patient is not nervous/anxious.   ? ?   ?Objective:  ?  ?Physical Exam ?Constitutional:   ?   General: She is not in acute distress. ?   Appearance: Normal appearance. She is not ill-appearing.  ?HENT:  ?   Head: Normocephalic and atraumatic.  ?   Right Ear: Tympanic membrane, ear canal and external ear normal.  ?   Left Ear: Tympanic membrane, ear canal and external ear normal.  ?Eyes:  ?   Extraocular Movements: Extraocular movements intact.  ?   Pupils: Pupils are equal, round, and reactive to light.  ?Cardiovascular:  ?   Rate and Rhythm: Normal rate and regular rhythm.  ?   Heart sounds: Normal heart sounds. No murmur heard. ?  No gallop.  ?Pulmonary:  ?   Effort: Pulmonary effort is normal. No respiratory distress.  ?   Breath sounds: Normal breath sounds. No wheezing or rales.  ?Abdominal:  ?    General: Bowel sounds are normal. There is no distension.  ?   Palpations: Abdomen is soft.  ?   Tenderness: There is no abdominal tenderness. There is no guarding.  ?Musculoskeletal:  ?   Comments: 5/5 strength in both upper and lower extremities   ?Skin: ?   General: Skin is warm and dry.  ?Neurological:  ?   Mental Status: She is alert and oriented to person, place, and time.  ?   Deep Tendon Reflexes:  ?   Reflex Scores: ?     Patellar reflexes are 2+ on the right side and 2+ on the left side. ?Psychiatric:     ?  Mood and Affect: Mood normal.     ?   Behavior: Behavior normal.     ?   Judgment: Judgment normal.  ? ? ?BP 119/65 (BP Location: Right Arm, Patient Position: Sitting, Cuff Size: Large)   Pulse 62   Temp 98.7 ?F (37.1 ?C) (Oral)   Resp 16   Ht _0  (1.778 m)   Wt 197 lb (89.4 kg)   LMP 07/12/2015   SpO2 100%   BMI 28.27 kg/m?  ?Wt Readings from Last 3 Encounters:  ?05/06/21 197 lb (89.4 kg)  ?04/30/20 195 lb 6.4 oz (88.6 kg)  ?11/14/19 199 lb 3.2 oz (90.4 kg)  ? ? ?   ?Assessment & Plan:  ? ?Problem List Items Addressed This Visit   ? ?  ? Unprioritized  ? Preventative health care  ?  Wt Readings from Last 3 Encounters:  ?05/06/21 197 lb (89.4 kg)  ?04/30/20 195 lb 6.4 oz (88.6 kg)  ?11/14/19 199 lb 3.2 oz (90.4 kg)  ?Continue healthy diet, exercise. Refer for mammo/colo. Encouraged pt to get the bivalent covid booster. She declines shingrix today and wishes to think about it. She states last pap was 2 yrs ago in [place].  She plans to repeat pap with her GYN in [place] in 1 year. Was reportedly normal. ?  ?  ? Relevant Orders  ? MM 3D SCREEN BREAST BILATERAL  ? Hyperlipidemia  ?  Advised her to continue to discuss her concern about statin myalgia with cardiology. Given her previous hx of CVA, she may be a candidate for PCSK9 inhibitors.  ?  ?  ? Hyperglycemia  ? Relevant Orders  ? Comp Met (CMET)  ? Hemoglobin A1c  ? ?Other Visit Diagnoses   ? ? Colon cancer screening    -  Primary  ?  Relevant Orders  ? Ambulatory referral to Gastroenterology  ? ?  ? ? ? ? ?No orders of the defined types were placed in this encounter. ? ? ?I, Nance Pear, NP, personally preformed the services described in this documentat

## 2021-05-06 NOTE — Assessment & Plan Note (Signed)
Advised her to continue to discuss her concern about statin myalgia with cardiology. Given her previous hx of CVA, she may be a candidate for PCSK9 inhibitors.  ?

## 2021-05-10 ENCOUNTER — Other Ambulatory Visit: Payer: Self-pay | Admitting: Physician Assistant

## 2021-06-05 ENCOUNTER — Ambulatory Visit
Admission: RE | Admit: 2021-06-05 | Discharge: 2021-06-05 | Disposition: A | Discharge status: Home / Self Care | Payer: BC Managed Care – PPO | Source: Ambulatory Visit | Attending: Family | Admitting: Family

## 2021-06-05 DIAGNOSIS — Z1231 Encounter for screening mammogram for malignant neoplasm of breast: Secondary | ICD-10-CM | POA: Diagnosis not present

## 2021-06-05 DIAGNOSIS — Z0000 Encounter for general adult medical examination without abnormal findings: Secondary | ICD-10-CM

## 2021-06-06 DIAGNOSIS — M25521 Pain in right elbow: Secondary | ICD-10-CM | POA: Diagnosis not present

## 2021-06-08 ENCOUNTER — Other Ambulatory Visit: Payer: Self-pay | Admitting: Cardiovascular Disease

## 2021-06-12 ENCOUNTER — Ambulatory Visit (AMBULATORY_SURGERY_CENTER): Payer: BC Managed Care – PPO

## 2021-06-12 VITALS — Ht 70.0 in | Wt 188.0 lb

## 2021-06-12 DIAGNOSIS — Z1211 Encounter for screening for malignant neoplasm of colon: Secondary | ICD-10-CM

## 2021-06-12 DIAGNOSIS — Z86010 Personal history of colonic polyps: Secondary | ICD-10-CM

## 2021-06-12 MED ORDER — NA SULFATE-K SULFATE-MG SULF 17.5-3.13-1.6 GM/177ML PO SOLN: 1.0000 | Freq: Once | ORAL | 0 refills | Status: AC

## 2021-06-12 NOTE — Progress Notes (Signed)
No egg or soy allergy known to patient  ?No issues known to pt with past sedation with any surgeries or procedures ?Patient denies ever being told they had issues or difficulty with intubation  ?No FH of Malignant Hyperthermia ?Pt is not on diet pills ?Pt is not on home 02  ?Pt is not on blood thinners  ?Pt denies issues with constipation at this time;  ?No A fib or A flutter ?NO PA's for preps discussed with pt in PV today  ?Discussed with pt there will be an out-of-pocket cost for prep and that varies from $0 to 70 + dollars - pt verbalized understanding  ?Pt instructed to use Singlecare.com or GoodRx for a price reduction on prep  ?PV completed over the phone. Pt verified name, DOB, address and insurance during PV today.  ?Pt mailed instruction packet with copy of consent form to read and not return, and instructions.  ?Pt encouraged to call with questions or issues.  ?If pt has My chart, procedure instructions sent via My Chart  ?Insurance confirmed with pt at [facility] today  ? ?

## 2021-06-18 ENCOUNTER — Encounter: Payer: Self-pay | Admitting: Gastroenterology

## 2021-06-25 ENCOUNTER — Encounter: Payer: Self-pay | Admitting: Gastroenterology

## 2021-06-26 ENCOUNTER — Ambulatory Visit (AMBULATORY_SURGERY_CENTER): Payer: BC Managed Care – PPO | Admitting: Gastroenterology

## 2021-06-26 ENCOUNTER — Encounter: Payer: Self-pay | Admitting: Gastroenterology

## 2021-06-26 VITALS — BP 103/58 | HR 62 | Temp 98.4°F | Resp 12 | Ht 70.0 in | Wt 188.0 lb

## 2021-06-26 DIAGNOSIS — Z1211 Encounter for screening for malignant neoplasm of colon: Secondary | ICD-10-CM

## 2021-06-26 DIAGNOSIS — K635 Polyp of colon: Secondary | ICD-10-CM | POA: Diagnosis not present

## 2021-06-26 DIAGNOSIS — D125 Benign neoplasm of sigmoid colon: Secondary | ICD-10-CM

## 2021-06-26 MED ORDER — SODIUM CHLORIDE 0.9 % IV SOLN: 500.0000 mL | Freq: Once | INTRAVENOUS | Status: DC

## 2021-06-26 NOTE — Progress Notes (Signed)
Report to PACU, RN, vss, BBS= Clear.  

## 2021-06-26 NOTE — Progress Notes (Signed)
Called to room to assist during endoscopic procedure.  Patient ID and intended procedure confirmed with present staff. Received instructions for my participation in the procedure from the performing physician.  

## 2021-06-26 NOTE — Progress Notes (Signed)
? ?History & Physical ? ?Primary Care Physician:  [name], NP ?Primary Gastroenterologist: Lucio Edward, MD ? ?CHIEF COMPLAINT:  CRC screening ? ?HPI: Tiffany Gardner is a 51 y.o. female here for CRC screening, average risk, with colonoscopy.  ? ? ?Past Medical History:  ?Diagnosis Date  ? Acute CVA (cerebrovascular accident) ([facility]) 01/23/2015  ? Acute embolic stroke ([place]) 1103  ? Atrial septal defect 2016  ? corrected with sx  ? Hyperlipidemia 01/22/2015  ? on meds  ? TIA (transient ischemic attack) 01/22/2015  ? ? ?Past Surgical History:  ?Procedure Laterality Date  ? CARDIAC CATHETERIZATION N/A 02/11/2015  ? Procedure: ASD Closure;  Surgeon: Sherren Mocha, MD;  Location: [facility];  Service: Cardiovascular;  Laterality: N/A;  ? TEE WITHOUT CARDIOVERSION N/A 01/24/2015  ? Procedure: TRANSESOPHAGEAL ECHOCARDIOGRAM (TEE);  Surgeon: Pixie Casino, MD;  Location: [facility];  Service: Cardiovascular;  Laterality: N/A;  ? ? ?Prior to Admission medications   ?Medication Sig Start Date End Date Taking? Authorizing Provider  ?CVS ASPIRIN ADULT LOW DOSE 81 MG chewable tablet CHEW 1 TABLET (81 MG TOTAL) BY MOUTH DAILY. 09/18/16  Yes Sherren Mocha, MD  ?ezetimibe (ZETIA) 10 MG tablet Take 1 tablet (10 mg total) by mouth daily. 09/04/20 06/26/21 Yes Weaver, Scott T, PA-C  ?rosuvastatin (CRESTOR) 10 MG tablet TAKE 1 TABLET BY MOUTH DAILY. PLEASE MAKE OVERDUE APPT WITH DR. Burt Knack BEFORE ANYMORE REFILLS. THANK YOU 1ST ATTEMPT 06/10/21  Yes Sherren Mocha, MD  ?ibuprofen (ADVIL) 600 MG tablet Take 600 mg by mouth every 6 (six) hours as needed.    [provider]  ?Multiple Vitamins-Minerals (MULTIVITAMIN WITH MINERALS) tablet Take 1 tablet by mouth daily.    [provider]  ? ? ?Current Outpatient Medications  ?Medication Sig Dispense Refill  ? CVS ASPIRIN ADULT LOW DOSE 81 MG chewable tablet CHEW 1 TABLET (81 MG TOTAL) BY MOUTH DAILY. 30 tablet 0  ? ezetimibe (ZETIA) 10 MG tablet Take 1  tablet (10 mg total) by mouth daily. 90 tablet 3  ? rosuvastatin (CRESTOR) 10 MG tablet TAKE 1 TABLET BY MOUTH DAILY. PLEASE MAKE OVERDUE APPT WITH DR. Burt Knack BEFORE ANYMORE REFILLS. THANK YOU 1ST ATTEMPT 30 tablet 0  ? ibuprofen (ADVIL) 600 MG tablet Take 600 mg by mouth every 6 (six) hours as needed.    ? Multiple Vitamins-Minerals (MULTIVITAMIN WITH MINERALS) tablet Take 1 tablet by mouth daily.    ? ?Current Facility-Administered Medications  ?Medication Dose Route Frequency Provider Last Rate Last Admin  ? 0.9 %  sodium chloride infusion  500 mL Intravenous Once Ladene Artist, MD      ? ? ?Allergies as of 06/26/2021  ? (No Known Allergies)  ? ? ?Family History  ?Problem Relation Age of Onset  ? Cancer Father 36  ?     throat CA/smoker  ? Diabetes type I Daughter   ? Colon cancer Neg Hx   ? Esophageal cancer Neg Hx   ? Colon polyps Neg Hx   ? Rectal cancer Neg Hx   ? Stomach cancer Neg Hx   ? ? ?Social History  ? ?Socioeconomic History  ? Marital status: Single  ?  Spouse name: Not on file  ? Number of children: Not on file  ? Years of education: Not on file  ? Highest education level: Not on file  ?Occupational History  ? Not on file  ?Tobacco Use  ? Smoking status: Never  ? Smokeless tobacco: Never  ?Vaping Use  ?  Vaping Use: Never used  ?Substance and Sexual Activity  ? Alcohol use: Yes  ?  Alcohol/week: 2.0 standard drinks  ?  Types: 2 Standard drinks or equivalent per week  ?  Comment: casual  ? Drug use: Never  ? Sexual activity: Yes  ?  Partners: Male  ?Other Topics Concern  ? Not on file  ?Social History Narrative  ? 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 Divorced  ? Dog and a cat  ? Enjoys cayaking, hiking, reading/cooking.  ? Completed college  ? Grew up in [place]- moved her in her mid 20's  ? ?Social Determinants of Health  ? ?Financial Resource Strain: Not on file  ?Food Insecurity: Not on file  ?Transportation Needs: Not on file  ?Physical Activity: Not on  file  ?Stress: Not on file  ?Social Connections: Not on file  ?Intimate Partner Violence: Not on file  ? ? ?Review of Systems: ? ?All systems reviewed an negative except where noted in HPI. ? ?Gen: Denies any fever, chills, sweats, anorexia, fatigue, weakness, malaise, weight loss, and sleep disorder ?CV: Denies chest pain, angina, palpitations, syncope, orthopnea, PND, peripheral edema, and claudication. ?Resp: Denies dyspnea at rest, dyspnea with exercise, cough, sputum, wheezing, coughing up blood, and pleurisy. ?GI: Denies vomiting blood, jaundice, and fecal incontinence.   Denies dysphagia or odynophagia. ?GU : Denies urinary burning, blood in urine, urinary frequency, urinary hesitancy, nocturnal urination, and urinary incontinence. ?MS: Denies joint pain, limitation of movement, and swelling, stiffness, low back pain, extremity pain. Denies muscle weakness, cramps, atrophy.  ?Derm: Denies rash, itching, dry skin, hives, moles, warts, or unhealing ulcers.  ?Psych: Denies depression, anxiety, memory loss, suicidal ideation, hallucinations, paranoia, and confusion. ?Heme: Denies bruising, bleeding, and enlarged lymph nodes. ?Neuro:  Denies any headaches, dizziness, paresthesias. ?Endo:  Denies any problems with DM, thyroid, adrenal function. ? ? ?Physical Exam: ?General:  Alert, well-developed, in NAD ?Head:  Normocephalic and atraumatic. ?Eyes:  Sclera clear, no icterus.   Conjunctiva pink. ?Ears:  Normal auditory acuity. ?Mouth:  No deformity or lesions.  ?Neck:  Supple; no masses . ?Lungs:  Clear throughout to auscultation.   No wheezes, crackles, or rhonchi. No acute distress. ?Heart:  Regular rate and rhythm; no murmurs. ?Abdomen:  Soft, nondistended, nontender. No masses, hepatomegaly. No obvious masses.  Normal bowel .    ?Rectal:  Deferred   ?Msk:  Symmetrical without gross deformities.Marland Kitchen ?Pulses:  Normal pulses noted. ?Extremities:  Without edema. ?Neurologic:  Alert and  oriented x4;  grossly normal  neurologically. ?Skin:  Intact without significant lesions or rashes. ?Cervical Nodes:  No significant cervical adenopathy. ?Psych:  Alert and cooperative. Normal mood and affect. ? ? ?Impression / Plan:  ? ?CRC screening, average risk, for colonoscopy. ? ?Beecher Furio T. Fuller Plan  06/26/2021, 10:17 AM ?See Shea Evans, [facility], to contact our on call provider ? ? ?  ?

## 2021-06-26 NOTE — Patient Instructions (Signed)
Information on polyps given to you today.  Await pathology results.  Resume previous diet and medications.  YOU HAD AN ENDOSCOPIC PROCEDURE TODAY AT THE [facility]:   Refer to the procedure report that was given to you for any specific questions about what was found during the examination.  If the procedure report does not answer your questions, please call your gastroenterologist to clarify.  If you requested that your care partner not be given the details of your procedure findings, then the procedure report has been included in a sealed envelope for you to review at your convenience later.  YOU SHOULD EXPECT: Some feelings of bloating in the abdomen. Passage of more gas than usual.  Walking can help get rid of the air that was put into your GI tract during the procedure and reduce the bloating. If you had a lower endoscopy (such as a colonoscopy or flexible sigmoidoscopy) you may notice spotting of blood in your stool or on the toilet paper. If you underwent a bowel prep for your procedure, you may not have a normal bowel movement for a few days.  Please Note:  You might notice some irritation and congestion in your nose or some drainage.  This is from the oxygen used during your procedure.  There is no need for concern and it should clear up in a day or so.  SYMPTOMS TO REPORT IMMEDIATELY:   Following lower endoscopy (colonoscopy or flexible sigmoidoscopy):  Excessive amounts of blood in the stool  Significant tenderness or worsening of abdominal pains  Swelling of the abdomen that is new, acute  Fever of 100F or higher   For urgent or emergent issues, a gastroenterologist can be reached at any hour by calling [phone]. Do not use MyChart messaging for urgent concerns.    DIET:  We do recommend a small meal at first, but then you may proceed to your regular diet.  Drink plenty of fluids but you should avoid alcoholic beverages for 24 hours.  ACTIVITY:  You should  plan to take it easy for the rest of today and you should NOT DRIVE or use heavy machinery until tomorrow (because of the sedation medicines used during the test).    FOLLOW UP: Our staff will call the number listed on your records 48-72 hours following your procedure to check on you and address any questions or concerns that you may have regarding the information given to you following your procedure. If we do not reach you, we will leave a message.  We will attempt to reach you two times.  During this call, we will ask if you have developed any symptoms of COVID 19. If you develop any symptoms (ie: fever, flu-like symptoms, shortness of breath, cough etc.) before then, please call [phone].  If you test positive for Covid 19 in the 2 weeks post procedure, please call and report this information to us.    If any biopsies were taken you will be contacted by phone or by letter within the next 1-3 weeks.  Please call us at [phone] if you have not heard about the biopsies in 3 weeks.    SIGNATURES/CONFIDENTIALITY: You and/or your care partner have signed paperwork which will be entered into your electronic medical record.  These signatures attest to the fact that that the information above on your After Visit Summary has been reviewed and is understood.  Full responsibility of the confidentiality of this discharge information lies with you and/or your care-partner. 

## 2021-06-26 NOTE — Op Note (Signed)
[facility] ?Patient Name: Tiffany Gardner ?Procedure Date: 06/26/2021 10:21 AM ?MRN: [id number] ?Endoscopist: Ladene Artist , MD ?Age: 51 ?Referring MD:  ?Date of Birth: 08-16-1970 ?Gender: Female ?Account #: [id number] ?Procedure:                Colonoscopy ?Indications:              Screening for colorectal malignant neoplasm ?Medicines:                Monitored Anesthesia Care ?Procedure:                Pre-Anesthesia Assessment: ?                          - Prior to the procedure, a History and Physical  ?                          was performed, and patient medications and  ?                          allergies were reviewed. The patient's tolerance of  ?                          previous anesthesia was also reviewed. The risks  ?                          and benefits of the procedure and the sedation  ?                          options and risks were discussed with the patient.  ?                          All questions were answered, and informed consent  ?                          was obtained. Prior Anticoagulants: The patient has  ?                          taken no previous anticoagulant or antiplatelet  ?                          agents. ASA Grade Assessment: II - A patient with  ?                          mild systemic disease. After reviewing the risks  ?                          and benefits, the patient was deemed in  ?                          satisfactory condition to undergo the procedure. ?                          After obtaining informed consent, the colonoscope  ?  was passed under direct vision. Throughout the  ?                          procedure, the patient's blood pressure, pulse, and  ?                          oxygen saturations were monitored continuously. The  ?                          CF [id number] #[id number] was introduced through the anus  ?                          and advanced to the the cecum, identified by  ?                          appendiceal orifice and  ileocecal valve. The  ?                          ileocecal valve, appendiceal orifice, and rectum  ?                          were photographed. The quality of the bowel  ?                          preparation was excellent. The colonoscopy was  ?                          performed without difficulty. The patient tolerated  ?                          the procedure well. ?Scope In: 10:24:58 AM ?Scope Out: 10:43:31 AM ?Scope Withdrawal Time: 0 hours 13 minutes 12 seconds  ?Total Procedure Duration: 0 hours 18 minutes 33 seconds  ?Findings:                 The perianal and digital rectal examinations were  ?                          normal. ?                          A 6 mm polyp was found in the sigmoid colon. The  ?                          polyp was sessile. The polyp was removed with a  ?                          cold snare. Resection and retrieval were complete. ?                          The exam was otherwise without abnormality on  ?                          direct and retroflexion views. ?Complications:  No immediate complications. Estimated blood loss:  ?                          None. ?Estimated Blood Loss:     Estimated blood loss: none. ?Impression:               - One 6 mm polyp in the sigmoid colon, removed with  ?                          a cold snare. Resected and retrieved. ?                          - The examination was otherwise normal on direct  ?                          and retroflexion views. ?Recommendation:           - Repeat colonoscopy after studies are complete for  ?                          surveillance based on pathology results. ?                          - Patient has a contact number available for  ?                          emergencies. The signs and symptoms of potential  ?                          delayed complications were discussed with the  ?                          patient. Return to normal activities tomorrow.  ?                          Written discharge  instructions were provided to the  ?                          patient. ?                          - Resume previous diet. ?                          - Continue present medications. ?                          - Await pathology results. ?Ladene Artist, MD ?06/26/2021 10:46:29 AM ?This report has been signed electronically. ?

## 2021-06-26 NOTE — Progress Notes (Signed)
Pt's states no medical or surgical changes since previsit or office visit. 

## 2021-06-30 ENCOUNTER — Telehealth: Payer: Self-pay | Admitting: *Deleted

## 2021-06-30 NOTE — Telephone Encounter (Signed)
?  Follow up Call- ? ? ?  06/26/2021  ?  9:57 AM  ?Call back number  ?Post procedure Call Back phone  # [phone]  ?Permission to leave phone message Yes  ?  ? ?Patient questions: ? ?Do you have a fever, pain , or abdominal swelling? No. ?Pain Score  0 * ? ?Have you tolerated food without any problems? Yes.   ? ?Have you been able to return to your normal activities? Yes.   ? ?Do you have any questions about your discharge instructions: ?Diet   No. ?Medications  No. ?Follow up visit  No. ? ?Do you have questions or concerns about your Care? No. ? ?Actions: ?* If pain score is 4 or above: ?No action needed, pain <4. ? ? ?

## 2021-07-05 ENCOUNTER — Other Ambulatory Visit: Payer: Self-pay | Admitting: Cardiovascular Disease

## 2021-07-07 ENCOUNTER — Encounter: Payer: Self-pay | Admitting: Gastroenterology

## 2021-07-10 NOTE — Progress Notes (Signed)
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 female with the above problem list.  She was last seen in March 2022.  She returns for follow-up.  She is here alone.  She has been doing well without chest discomfort, syncope, orthopnea, leg edema.  She still notes shortness of breath with more extreme activities.  This has been ongoing since her PFO closure.  She has not had any significant change in her symptoms.        Past Medical History:  Diagnosis Date   Acute CVA (cerebrovascular accident) ([place]) 93/81/8299   Acute embolic stroke ([place]) 3716   Atrial septal defect 2016   corrected with sx   Hyperlipidemia 01/22/2015   on meds   TIA (transient ischemic attack) 01/22/2015   Current Medications: Current Meds  Medication Sig   [facility] ASPIRIN ADULT LOW DOSE 81 MG chewable tablet CHEW 1 TABLET (81 MG TOTAL) BY MOUTH DAILY.   ezetimibe (ZETIA) 10 MG tablet Take 1 tablet (10 mg total) by mouth daily.   Multiple Vitamins-Minerals (MULTIVITAMIN WITH MINERALS) tablet Take 1 tablet by mouth daily.   rosuvastatin (CRESTOR) 10 MG tablet TAKE 1 TABLET BY MOUTH DAILY. PLEASE MAKE OVERDUE APPT WITH DR. Burt Knack BEFORE ANYMORE REFILLS. THANK YOU 1ST ATTEMPT    Allergies:   Patient has no known allergies.   Social History   Tobacco Use   Smoking status: Never   Smokeless tobacco: Never  Vaping Use   Vaping Use: Never used  Substance Use Topics   Alcohol use:  Yes    Alcohol/week: 2.0 standard drinks    Types: 2 Standard drinks or equivalent per week    Comment: casual   Drug use: Never    Family Hx: The patient's family history includes Cancer (age of onset: 45) in her father; Diabetes type I in her daughter. There is no history of Colon cancer, Esophageal cancer, Colon polyps, Rectal cancer, or Stomach cancer.  Review of Systems  Cardiovascular:  Negative for palpitations.    EKGs/Labs/Other Test Reviewed:    EKG:  EKG is   ordered today.  The ekg ordered today demonstrates NSR, HR 69, normal axis, no ST-T wave changes, QTc 450  Recent Labs: 05/06/2021: ALT 21; BUN 13; Creatinine, Ser 0.81; Potassium 4.5; Sodium 138   Recent Lipid Panel Recent Labs    12/11/20 0924  CHOL 160  TRIG 151*  HDL 42  LDLCALC 92     Risk Assessment/Calculations:         Physical Exam:    VS:  BP 112/64   Pulse 69   Ht '5\' 10"'$  (1.778 m)   Wt 200 lb 12.8 oz (91.1 kg)   LMP 07/12/2015   SpO2 97%   BMI 28.81 kg/m     Wt Readings from Last 3 Encounters:  07/11/21 200 lb 12.8 oz (91.1 kg)  06/26/21 188 lb (85.3 kg)  06/12/21  188 lb (85.3 kg)    Constitutional:      Appearance: Healthy appearance. Not in distress.  Neck:     Vascular: JVD normal.  Pulmonary:     Effort: Pulmonary effort is normal.     Breath sounds: No wheezing. No rales.  Cardiovascular:     Normal rate. Regular rhythm. Normal S1. Normal S2.      Murmurs: There is no murmur.  Edema:    Peripheral edema absent.  Abdominal:     Palpations: Abdomen is soft.  Skin:    General: Skin is warm and dry.  Neurological:     Mental Status: Alert and oriented to person, place and time.     Cranial Nerves: Cranial nerves are intact.        ASSESSMENT & PLAN:   ASD (atrial septal defect), ostium secundum Hx of CVA s/p Patent Foramen Ovale closure.  Last echocardiogram was in 2018.  She notes a hx of shortness of breath with more extreme exertion since her procedure.  It has not  changed over time.  She is not having CHF symptoms or angina.  Will repeat echocardiogram now as it has been 5 years since her last study.  F/u 1 year or sooner if symptoms worsen.   Hyperlipidemia She is intol to higher dose statins.  She still has a lot of leg pain with the Crestor 10 mg once daily.  Lipids are ok on current Rx.  Ideally it would be good to see LDL < 70 with hx of CVA.  Continue Zetia 10 mg once daily, Crestor 10 mg once daily.  Will refer to [facility] for consideration of PCSK9 inhibitor.  If she qualifies, we may be able to get her off of the statin.    1. ASD (atrial septal defect), ostium secundum Hx of CVA.  S/p closure.  Echocardiogram in 2018 without shunt.     2. Mixed hyperlipidemia She has a lot of myalgias and muscle weakness related to statin Rx.  She is also concerned about her sugars.  I will stop her Atorvastatin.  She should remain off of statin Rx for 6 weeks, then start Rosuvastatin 10 mg once daily.  If she has recurrent symptoms with this, we can look into alternative Rx.  Plan fasting CMET, Lipids after 3 mos of Rx with Rosuvastatin.           Dispo:  Return in about 1 year (around 07/12/2022) for Routine Follow Up, w/ Dr. Burt Knack, or Richardson Dopp, PA-C.   Medication Adjustments/Labs and Tests Ordered: Current medicines are reviewed at length with the patient today.  Concerns regarding medicines are outlined above.  Tests Ordered: Orders Placed This Encounter  Procedures   AMB Referral to [facility]   EKG 12-Lead   ECHOCARDIOGRAM COMPLETE   Medication Changes: No orders of the defined types were placed in this encounter.  Signed, Richardson Dopp, PA-C  07/11/2021 9:30 AM    [facility] [place], [place], [place]  [address] Phone: [phone]; Fax: [phone]

## 2021-07-11 ENCOUNTER — Ambulatory Visit (INDEPENDENT_AMBULATORY_CARE_PROVIDER_SITE_OTHER): Payer: BC Managed Care – PPO | Admitting: Physician Assistant

## 2021-07-11 ENCOUNTER — Encounter: Payer: Self-pay | Admitting: Physician Assistant

## 2021-07-11 VITALS — BP 112/64 | HR 69 | Ht 70.0 in | Wt 200.8 lb

## 2021-07-11 DIAGNOSIS — E782 Mixed hyperlipidemia: Secondary | ICD-10-CM | POA: Diagnosis not present

## 2021-07-11 DIAGNOSIS — Q2111 Secundum atrial septal defect: Secondary | ICD-10-CM

## 2021-07-11 NOTE — Assessment & Plan Note (Signed)
She is intol to higher dose statins.  She still has a lot of leg pain with the Crestor 10 mg once daily.  Lipids are ok on current Rx.  Ideally it would be good to see LDL < 70 with hx of CVA.  Continue Zetia 10 mg once daily, Crestor 10 mg once daily.  Will refer to [facility] for consideration of PCSK9 inhibitor.  If she qualifies, we may be able to get her off of the statin.

## 2021-07-11 NOTE — Assessment & Plan Note (Signed)
Hx of CVA s/p Patent Foramen Ovale closure.  Last echocardiogram was in 2018.  She notes a hx of shortness of breath with more extreme exertion since her procedure.  It has not changed over time.  She is not having CHF symptoms or angina.  Will repeat echocardiogram now as it has been 5 years since her last study.  F/u 1 year or sooner if symptoms worsen.

## 2021-07-11 NOTE — Patient Instructions (Signed)
Medication Instructions:  Your physician recommends that you continue on your current medications as directed. Please refer to the Current Medication list given to you today.  *If you need a refill on your cardiac medications before your next appointment, please call your pharmacy*   Lab Work: None ordered  If you have labs (blood work) drawn today and your tests are completely normal, you will receive your results only by: [place] (if you have MyChart) OR A paper copy in the mail If you have any lab test that is abnormal or we need to change your treatment, we will call you to review the results.   Testing/Procedures: Your physician has requested that you have an echocardiogram. Echocardiography is a painless test that uses sound waves to create images of your heart. It provides your doctor with information about the size and shape of your heart and how well your heart's chambers and valves are working. This procedure takes approximately one hour. There are no restrictions for this procedure.   You have been referred to [facility] for discussion of PCSK9 for Cholesterol    Follow-Up: At [facility], you and your health needs are our priority.  As part of our continuing mission to provide you with exceptional heart care, we have created designated Provider Care Teams.  These Care Teams include your primary Cardiologist (physician) and Advanced Practice Providers (APPs -  Physician Assistants and Nurse Practitioners) who all work together to provide you with the care you need, when you need it.  We recommend signing up for the patient portal called "MyChart".  Sign up information is provided on this After Visit Summary.  MyChart is used to connect with patients for Virtual Visits (Telemedicine).  Patients are able to view lab/test results, encounter notes, upcoming appointments, etc.  Non-urgent messages can be sent to your provider as well.   To learn more about what you can  do with MyChart, go to [email or web address].    Your next appointment:   1 year(s)  The format for your next appointment:   In Person  Provider:   Sherren Mocha, MD     Other Instructions   Important Information About Sugar

## 2021-07-31 ENCOUNTER — Other Ambulatory Visit: Payer: Self-pay | Admitting: Cardiovascular Disease

## 2021-08-22 ENCOUNTER — Other Ambulatory Visit: Payer: Self-pay | Admitting: Physician Assistant

## 2021-09-03 ENCOUNTER — Ambulatory Visit (HOSPITAL_COMMUNITY): Payer: BC Managed Care – PPO | Attending: Internal Medicine

## 2021-09-03 ENCOUNTER — Ambulatory Visit (INDEPENDENT_AMBULATORY_CARE_PROVIDER_SITE_OTHER): Payer: BC Managed Care – PPO | Admitting: Pharmacist

## 2021-09-03 ENCOUNTER — Telehealth: Payer: Self-pay | Admitting: Pharmacist

## 2021-09-03 DIAGNOSIS — E782 Mixed hyperlipidemia: Secondary | ICD-10-CM | POA: Diagnosis not present

## 2021-09-03 DIAGNOSIS — Q2111 Secundum atrial septal defect: Secondary | ICD-10-CM | POA: Insufficient documentation

## 2021-09-03 DIAGNOSIS — I639 Cerebral infarction, unspecified: Secondary | ICD-10-CM | POA: Diagnosis not present

## 2021-09-03 LAB — ECHOCARDIOGRAM COMPLETE
Area-P 1/2: 4.8 cm2
S' Lateral: 3.4 cm

## 2021-09-03 NOTE — Patient Instructions (Addendum)
You can stop rosuvastatin Please continue ezetimibe I will submit a prior authorization for Repatha I will call you once I hear back  Please call me at [phone] with any questions

## 2021-09-03 NOTE — Telephone Encounter (Signed)
[facility] is now [name] [id number] ID [id number] Pt name on insurance is Tiffany Moats PA for Repatha submitted

## 2021-09-03 NOTE — Progress Notes (Unsigned)
Patient ID: Tiffany Gardner                 DOB: 1970-07-12                    MRN: [id number]     HPI: Tiffany Gardner is a 51 y.o. female patient of Dr. Antionette Char referred to lipid clinic by Richardson Dopp, PA. PMH is significant for CVA in 2016 s/p PFO closure, HLD and pre-diabetes. Patient has hx of myalgias with higher dose statins. Currently on rousvastatin '10mg'$  daily and ezetimibe '10mg'$  daily. Still having issues with rosuvastatin. She was referred to lipid clinic to discuss PCSK9i.   Patient presents today to lipid clinic. She reports issues with muscle aches with rosuvastatin. She is very active, walks multiple days per week. Goes to the gym, does yoga and kayaks. Overall she eats a pretty good diet. Working to cut back on deli meats.   Current Medications: rosuvastatin '10mg'$  daily, ezetimibe '10mg'$  daily Intolerances: atorvastatin Risk Factors: CVA LDL goal: <70  Diet: breakfast: smoothies if she does eat breakfast, toast and egg Lunch: salad and sandwich or left overs Dinner: white rice vegetables - only eats meat 2-3 times a week Drink: water, coffee  Snacks: nuts, not much snacking, fruit  Exercise: 20-30,000 steps, walks every day or every other day, kayaks, yoga, goes to gym 2-3 days a week  Family History: The patient's family history includes Cancer (age of onset: 48) in her father; Diabetes type I in her daughter.  Social History: no tobacco, occasionally alcohol  Labs:12/11/20 TC 160, TG 151, HDL 42, LDL-C 92 (rosuvastatin '10mg'$  daily, ezetimbie '10mg'$  daily)  Past Medical History:  Diagnosis Date   Acute CVA (cerebrovascular accident) ([place]) 40/98/1191   Acute embolic stroke ([place]) 4782   Atrial septal defect 2016   corrected with sx   Hyperlipidemia 01/22/2015   on meds   TIA (transient ischemic attack) 01/22/2015    Current Outpatient Medications on File Prior to Visit  Medication Sig Dispense Refill   [facility] ASPIRIN ADULT LOW DOSE 81 MG chewable tablet CHEW 1 TABLET (81 MG TOTAL)  BY MOUTH DAILY. 30 tablet 0   ezetimibe (ZETIA) 10 MG tablet TAKE 1 TABLET BY MOUTH EVERY DAY 90 tablet 3   Multiple Vitamins-Minerals (MULTIVITAMIN WITH MINERALS) tablet Take 1 tablet by mouth daily.     rosuvastatin (CRESTOR) 10 MG tablet TAKE 1 TABLET BY MOUTH DAILY. PLEASE MAKE OVERDUE APPT WITH DR. Burt Knack BEFORE ANYMORE REFILLS. THANK YOU 1ST ATTEMPT 90 tablet 3   No current facility-administered medications on file prior to visit.    No Known Allergies  Assessment/Plan:  1. Hyperlipidemia - LDL-C is above goal of <70. Patient having myalgias to rosuvastatin '10mg'$  daily. Will stop rosuvastatin. We discussed PCSK9i. Reviewed cost, injection technique, efficacy and side effects. Patient would like to try. I will submit a prior auth for Repatha. Continue ezetimibe '10mg'$  daily. 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 I will call patient once I hear back from insurance.   Thank you,   Ramond Dial, Pharm.D, BCPS, CPP [place]  [address], [place], [place] [address]  Phone: [phone]; Fax: [phone]

## 2021-09-04 ENCOUNTER — Other Ambulatory Visit: Payer: Self-pay | Admitting: Cardiovascular Disease

## 2021-09-04 MED ORDER — REPATHA SURECLICK 140 MG/ML ~~LOC~~ SOAJ: 1.0000 | SUBCUTANEOUS | 11 refills | Status: DC

## 2021-09-04 NOTE — Telephone Encounter (Signed)
Received refill request for PCSK9i with message that Repatha is not covered on pt's plan and to send in for Praluent instead. This has been done, still anticipate prior Tiffany Gardner will be needed, will await info from pharmacy.

## 2021-09-04 NOTE — Telephone Encounter (Signed)
Received fax that [facility] [employer] does not process prior authorizations for pt's plan. Will try sending rx to pharmacy and see if they're able to populate the correct prior auth form for office to fill out.

## 2021-09-10 NOTE — Telephone Encounter (Signed)
Submitted PA online directly to [employer]. Patient updated on what the delay was.

## 2021-09-12 NOTE — Telephone Encounter (Signed)
Called [employer] [place] at [phone] Tiffany Gardner is the preferred. They switched PA to Praluent and it is approved through 12/13/21.  [email or web address]  Link for form for next PA above. Cannot do in covermymeds.   Called pharmacy, cost is $35

## 2021-10-01 ENCOUNTER — Ambulatory Visit: Payer: BC Managed Care – PPO | Admitting: Cardiovascular Disease

## 2021-10-13 ENCOUNTER — Encounter: Payer: Self-pay | Admitting: Cardiovascular Disease

## 2021-10-13 ENCOUNTER — Ambulatory Visit (INDEPENDENT_AMBULATORY_CARE_PROVIDER_SITE_OTHER): Payer: BC Managed Care – PPO | Admitting: Cardiovascular Disease

## 2021-10-13 VITALS — BP 110/80 | HR 82 | Ht 70.0 in | Wt 204.0 lb

## 2021-10-13 DIAGNOSIS — Q2111 Secundum atrial septal defect: Secondary | ICD-10-CM

## 2021-10-13 NOTE — Patient Instructions (Signed)
Medication Instructions:  Your physician recommends that you continue on your current medications as directed. Please refer to the Current Medication list given to you today.  *If you need a refill on your cardiac medications before your next appointment, please call your pharmacy*  Follow-Up: At [facility], you and your health needs are our priority.  As part of our continuing mission to provide you with exceptional heart care, we have created designated Provider Care Teams.  These Care Teams include your primary Cardiologist (physician) and Advanced Practice Providers (APPs -  Physician Assistants and Nurse Practitioners) who all work together to provide you with the care you need, when you need it.  We recommend signing up for the patient portal called "MyChart".  Sign up information is provided on this After Visit Summary.  MyChart is used to connect with patients for Virtual Visits (Telemedicine).  Patients are able to view lab/test results, encounter notes, upcoming appointments, etc.  Non-urgent messages can be sent to your provider as well.   To learn more about what you can do with MyChart, go to [email or web address].    Your next appointment:   12 month(s)  The format for your next appointment:   In Person  Provider:   Sherren Mocha, MD {

## 2021-10-13 NOTE — Progress Notes (Signed)
Cardiology Office Note:    Date:  10/13/2021   ID:  Tiffany Gardner, DOB [birth year], MRN [id number]  PCP:  [name], NP   [facility] Cardiologist:  Sherren Mocha, MD     Referring MD: [name], NP   Chief Complaint  Patient presents with   Follow-up    ASD s/p closure    History of Present Illness:    Tiffany Gardner is a 51 y.o. 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  She has been doing well and her only complaint is fatigue this is longstanding without recent change.  She denies chest pain, heart palpitations, shortness of breath, or any recurrent neurologic symptoms.  She denies leg edema, orthopnea, or PND.  She recently underwent an echocardiogram that demonstrated some flow around one of her Amplatzer atrial septal occluder devices and she comes in today to discuss consideration of further testing with transesophageal echo.  She remains on aspirin for antiplatelet therapy.  She was recently switched from rosuvastatin to Praluent due to myalgias which have completely resolved since stopping rosuvastatin.  Past Medical History:  Diagnosis Date   Acute CVA (cerebrovascular accident) ([place]) 33/82/5053   Acute embolic stroke ([place]) 9767   Atrial septal defect 2016   corrected with sx   Hyperlipidemia 01/22/2015   on meds   TIA (transient ischemic attack) 01/22/2015    Past Surgical History:  Procedure Laterality Date   CARDIAC CATHETERIZATION N/A 02/11/2015   Procedure: ASD Closure;  Surgeon: Sherren Mocha, MD;  Location: [facility];  Service: Cardiovascular;  Laterality: N/A;   TEE WITHOUT CARDIOVERSION N/A 01/24/2015   Procedure: TRANSESOPHAGEAL ECHOCARDIOGRAM (TEE);  Surgeon: Pixie Casino, MD;  Location: [facility] ENDOSCOPY;  Service: Cardiovascular;  Laterality: N/A;    Current Medications: Current Meds   Medication Sig   Alirocumab (PRALUENT) 75 MG/ML SOAJ Inject 1 Pen into the skin every 14 (fourteen) days.   [employer] ASPIRIN ADULT LOW DOSE 81 MG chewable tablet CHEW 1 TABLET (81 MG TOTAL) BY MOUTH DAILY.   ezetimibe (ZETIA) 10 MG tablet TAKE 1 TABLET BY MOUTH EVERY DAY   Multiple Vitamins-Minerals (MULTIVITAMIN WITH MINERALS) tablet Take 1 tablet by mouth daily.     Allergies:   Patient has no known allergies.   Social History   Socioeconomic History   Marital status: Single    Spouse name: Not on file   Number of children: Not on file   Years of education: Not on file   Highest education level: Not on file  Occupational History   Not on file  Tobacco Use   Smoking status: Never   Smokeless tobacco: Never  Vaping Use   Vaping Use: Never used  Substance and Sexual Activity   Alcohol use: Yes    Alcohol/week: 2.0 standard drinks of alcohol    Types: 2 Standard drinks or equivalent per week    Comment: casual   Drug use: Never   Sexual activity: Yes    Partners: Male  Other Topics Concern   Not on file  Social History Narrative   Works as an Solicitor in Retail   2 children (both daughters) 29 and 17   Divorced   Dog and a Neurosurgeon   Enjoys cayaking, hiking, reading/cooking.   Completed college   Grew up in [place]- moved her in her mid 20's  Social Determinants of Health   Financial Resource Strain: Not on file  Food Insecurity: Not on file  Transportation Needs: Not on file  Physical Activity: Not on file  Stress: Not on file  Social Connections: Not on file     Family History: The patient's family history includes Cancer (age of onset: 38) in her father; Diabetes type I in her daughter. There is no history of Colon cancer, Esophageal cancer, Colon polyps, Rectal cancer, or Stomach cancer.  ROS:   Please see the history of present illness.    Positive for fatigue.  All other systems reviewed and are negative.  EKGs/Labs/Other Studies Reviewed:    The following  studies were reviewed today: 2D Echo: IMPRESSIONS     1. Left ventricular ejection fraction, by estimation, is 50 to 55%. The  left ventricle has low normal function. The left ventricle has no regional  wall motion abnormalities. Left ventricular diastolic parameters are  indeterminate.   2. Right ventricular systolic function is normal. The right ventricular  size is normal. There is normal pulmonary artery systolic pressure.   3. Flow can be seen in the inferior portion of the amplazter. No  significant interatrial shunt.   4. The mitral valve is normal in structure. No evidence of mitral valve  regurgitation.   5. The aortic valve is normal in structure. Aortic valve regurgitation is  not visualized.   Conclusion(s)/Recommendation(s): Consider TEE to better assess whether  amplatzer closure is optimal.  EKG:  EKG is not ordered today.   Recent Labs: 05/06/2021: ALT 21; BUN 13; Creatinine, Ser 0.81; Potassium 4.5; Sodium 138  Recent Lipid Panel    Component Value Date/Time   CHOL 160 12/11/2020 0924   TRIG 151 (H) 12/11/2020 0924   HDL 42 12/11/2020 0924   CHOLHDL 3.8 12/11/2020 0924   CHOLHDL 3.8 11/14/2019 1008   VLDL 31.0 07/05/2017 1352   LDLCALC 92 12/11/2020 0924   LDLCALC 102 (H) 11/14/2019 1008     Risk Assessment/Calculations:                Physical Exam:    VS:  BP 110/80   Pulse 82   Ht '5\' 10"'$  (1.778 m)   Wt 204 lb (92.5 kg)   LMP 02/24/2015   SpO2 98%   BMI 29.27 kg/m     Wt Readings from Last 3 Encounters:  10/13/21 204 lb (92.5 kg)  07/11/21 200 lb 12.8 oz (91.1 kg)  06/26/21 188 lb (85.3 kg)     GEN:  Well nourished, well developed in no acute distress HEENT: Normal NECK: No JVD; No carotid bruits LYMPHATICS: No lymphadenopathy CARDIAC: RRR, no murmurs, rubs, gallops RESPIRATORY:  Clear to auscultation without rales, wheezing or rhonchi  ABDOMEN: Soft, non-tender, non-distended MUSCULOSKELETAL:  No edema; No deformity  SKIN: Warm  and dry NEUROLOGIC:  Alert and oriented x 3 PSYCHIATRIC:  Normal affect   ASSESSMENT:    1. ASD (atrial septal defect), ostium secundum    PLAN:    In order of problems listed above:  The patient has undergone complex transcatheter closure of a multi fenestrated atrial septal defect.  She has had no change in any of her symptoms and no recurrent neurologic events since her procedure in 2016.  I personally reviewed her echo images which show normal LV and RV size and function as well as normal atrial size.  There does appear to be some flow around the interatrial septum but somewhat difficult to evaluate from  a surface echo.  I went back and reviewed my procedure note as the patient was known to have at least 3 different defects in the atrial septum.  2 devices were used because 2 of the defects were close in proximity.  She certainly has a higher risk of a residual defect and I think it would be reasonable to pursue transesophageal echo to better understand her interatrial septal anatomy and presence of residual ASD.  The patient does not have any heart failure symptoms, nor does she have any evidence of right heart enlargement, so I do not think she has a hemodynamically significant ASD.  The question is more in regards to risk of recurrent TIA/stroke.  After reviewing the transesophageal echo procedure and discussing the rationale for pursuing this as well as the potential risk of the procedure, the patient does not wish to proceed at this time.  She would like continue on her current medical therapy.  She would like to return in 1 year for follow-up evaluation.  I do not see any reason to repeat another surface echo next year when she comes in for a visit.  We will discuss further at the time of her next evaluation.  We had extensive discussion today about the potential for stroke risk if there is a residual atrial septal defect considering her symptoms.  However, I suspect most of her risk is  mitigated after closing the largest defect in the area of the foramen ovale.  Again, the patient wishes to defer any more evaluation and would like to continue on her current medical program.  I will see her back in 1 year.  If she changes her mind to schedule a TEE, she will notify us.           Medication Adjustments/Labs and Tests Ordered: Current medicines are reviewed at length with the patient today.  Concerns regarding medicines are outlined above.  No orders of the defined types were placed in this encounter.  No orders of the defined types were placed in this encounter.   Patient Instructions  Medication Instructions:  Your physician recommends that you continue on your current medications as directed. Please refer to the Current Medication list given to you today.  *If you need a refill on your cardiac medications before your next appointment, please call your pharmacy*  Follow-Up: At [facility], you and your health needs are our priority.  As part of our continuing mission to provide you with exceptional heart care, we have created designated Provider Care Teams.  These Care Teams include your primary Cardiologist (physician) and Advanced Practice Providers (APPs -  Physician Assistants and Nurse Practitioners) who all work together to provide you with the care you need, when you need it.  We recommend signing up for the patient portal called "MyChart".  Sign up information is provided on this After Visit Summary.  MyChart is used to connect with patients for Virtual Visits (Telemedicine).  Patients are able to view lab/test results, encounter notes, upcoming appointments, etc.  Non-urgent messages can be sent to your provider as well.   To learn more about what you can do with MyChart, go to [email or web address].    Your next appointment:   12 month(s)  The format for your next appointment:   In Person  Provider:   Sherren Mocha, MD {            Signed, Sherren Mocha, MD  10/13/2021 1:18 PM    [place]

## 2021-12-02 ENCOUNTER — Ambulatory Visit: Payer: BC Managed Care – PPO | Attending: Cardiovascular Disease

## 2021-12-02 DIAGNOSIS — E782 Mixed hyperlipidemia: Secondary | ICD-10-CM

## 2021-12-03 LAB — LIPID PANEL
Chol/HDL Ratio: 4.8 ratio — ABNORMAL HIGH (ref 0.0–4.4)
Cholesterol, Total: 201 mg/dL — ABNORMAL HIGH (ref 100–199)
HDL: 42 mg/dL (ref >39)
LDL Chol Calc (NIH): 121 mg/dL — ABNORMAL HIGH (ref 0–99)
Triglycerides: 213 mg/dL — ABNORMAL HIGH (ref 0–149)
VLDL Cholesterol Cal: 38 mg/dL (ref 5–40)

## 2021-12-03 LAB — APOLIPOPROTEIN B: Apolipoprotein B: 111 mg/dL — ABNORMAL HIGH (ref <90)

## 2021-12-05 ENCOUNTER — Telehealth: Payer: Self-pay | Admitting: Pharmacist

## 2021-12-05 DIAGNOSIS — G459 Transient cerebral ischemic attack, unspecified: Secondary | ICD-10-CM

## 2021-12-05 NOTE — Telephone Encounter (Signed)
Called patient to review labs. LDL-C and ApoB higher than expected on Praluent. Will need to confirm compliance and that patient has not missed any doses. LVM for patient to call back.  If patient has been compliant, then will increase to Praluent '150mg'$  q 14 days.

## 2021-12-08 MED ORDER — PRALUENT 150 MG/ML ~~LOC~~ SOAJ: 1.0000 mL | SUBCUTANEOUS | 11 refills | Status: DC

## 2021-12-08 NOTE — Telephone Encounter (Signed)
Reviewed labs with patient. She says that she didn't have anything to eat that AM, but she might have only been fasting 7-8hr. Will increase Praluent to '150mg'$  and recheck lipids in 3 month.

## 2022-02-24 ENCOUNTER — Other Ambulatory Visit (HOSPITAL_COMMUNITY): Payer: Self-pay

## 2022-02-25 ENCOUNTER — Other Ambulatory Visit (HOSPITAL_COMMUNITY): Payer: Self-pay

## 2022-02-26 ENCOUNTER — Telehealth: Payer: Self-pay

## 2022-02-26 ENCOUNTER — Other Ambulatory Visit (HOSPITAL_COMMUNITY): Payer: Self-pay

## 2022-02-26 NOTE — Telephone Encounter (Signed)
Pharmacy Patient Advocate Encounter  Received notification from [facility] that the request for prior authorization for Praluent '150MG'$ /ML has been denied due to ''Patient has not maintained improvement in LDL-Levels.  I have also spoke with the patient and she states she has stopped taking the medication as well.

## 2022-03-16 NOTE — Telephone Encounter (Signed)
Discussed with patient the denial of Praluent. Advised we could try to appeal or redo as a new start. Patient states she did have some muscle pains on it and after 2 weeks of not taking, she felt better. We discussed Nexlizet ((no hx of gout) or Leqvio. Patient will do some of her own research and get back to me. She is not interested in any more statins.

## 2022-03-18 ENCOUNTER — Other Ambulatory Visit: Payer: BC Managed Care – PPO

## 2022-09-03 DIAGNOSIS — M25521 Pain in right elbow: Secondary | ICD-10-CM | POA: Diagnosis not present

## 2022-10-19 ENCOUNTER — Ambulatory Visit: Payer: BC Managed Care – PPO | Attending: Nurse Practitioner | Admitting: Cardiovascular Disease

## 2022-10-19 ENCOUNTER — Encounter: Payer: Self-pay | Admitting: Cardiovascular Disease

## 2022-10-19 VITALS — BP 116/84 | HR 72 | Ht 70.0 in | Wt 188.6 lb

## 2022-10-19 DIAGNOSIS — E782 Mixed hyperlipidemia: Secondary | ICD-10-CM | POA: Diagnosis not present

## 2022-10-19 DIAGNOSIS — Q2111 Secundum atrial septal defect: Secondary | ICD-10-CM

## 2022-10-19 NOTE — Patient Instructions (Signed)
Medication Instructions:  Your physician recommends that you continue on your current medications as directed. Please refer to the Current Medication list given to you today.  *If you need a refill on your cardiac medications before your next appointment, please call your pharmacy*   Lab Work: NONE If you have labs (blood work) drawn today and your tests are completely normal, you will receive your results only by: MyChart Message (if you have MyChart) OR A paper copy in the mail If you have any lab test that is abnormal or we need to change your treatment, we will call you to review the results.   Testing/Procedures: ECHO (prior to next visit) Your physician has requested that you have an echocardiogram. Echocardiography is a painless test that uses sound waves to create images of your heart. It provides your doctor with information about the size and shape of your heart and how well your heart's chambers and valves are working. This procedure takes approximately one hour. There are no restrictions for this procedure. Please do NOT wear cologne, perfume, aftershave, or lotions (deodorant is allowed). Please arrive 15 minutes prior to your appointment time.  Follow-Up: At [facility], you and your health needs are our priority.  As part of our continuing mission to provide you with exceptional heart care, we have created designated Provider Care Teams.  These Care Teams include your primary Cardiologist (physician) and Advanced Practice Providers (APPs -  Physician Assistants and Nurse Practitioners) who all work together to provide you with the care you need, when you need it.  Your next appointment:   1 year(s)  Provider:   Tonny Bollman, MD

## 2022-10-19 NOTE — Progress Notes (Signed)
Cardiology Office Note:    Date:  10/19/2022   ID:  Tiffany Gardner, DOB 05-31-1970, MRN [id number]  PCP:  Tiffany Craze, NP   [facility] Providers Cardiologist:  Tiffany Bollman, MD     Referring MD: Tiffany Craze, NP   Chief Complaint  Patient presents with   Follow-up    History of Present Illness:    Tiffany Gardner is a 52 y.o. female with a hx of stroke in 2016 and discovery of a complex fenestrated ASD at that time.  She underwent transcatheter closure with 2 devices implanted.  She was noted to have 3 different defects at least, but there was no residual flow seen across the septum after placement of 2 atrial septal occluder devices.  She had a surveillance echocardiogram last year that showed some residual flow across the inferior portion of the septum.  We discussed further evaluation with a transesophageal echo, but the patient declined.  She felt that she is doing well on antiplatelet therapy with aspirin with no recurrent events since 2016 when she underwent closure.  She opted to continue with medical management.  The patient is here alone today for follow-up.  She is doing well with no chest pain, heart palpitations, shortness of breath, or other complaints at this time.  She is playing pickleball and hiking for exercise with no exertional symptoms.  She has stopped taking all of her lipid-lowering medicines as she was experiencing a lot of side effects from these.  She is tolerating low-dose aspirin without problems.  Past Medical History:  Diagnosis Date   Acute CVA (cerebrovascular accident) ([facility]) 01/23/2015   Acute embolic stroke ([facility]) 2016   Atrial septal defect 2016   corrected with sx   Hyperlipidemia 01/22/2015   on meds   TIA (transient ischemic attack) 01/22/2015    Past Surgical History:  Procedure Laterality Date   CARDIAC CATHETERIZATION N/A 02/11/2015   Procedure: ASD Closure;  Surgeon: Tiffany Bollman, MD;  Location: [facility] INVASIVE CV LAB;   Service: Cardiovascular;  Laterality: N/A;   TEE WITHOUT CARDIOVERSION N/A 01/24/2015   Procedure: TRANSESOPHAGEAL ECHOCARDIOGRAM (TEE);  Surgeon: Tiffany Nose, MD;  Location: [facility] ENDOSCOPY;  Service: Cardiovascular;  Laterality: N/A;    Current Medications: Current Meds  Medication Sig   CVS ASPIRIN ADULT LOW DOSE 81 MG chewable tablet CHEW 1 TABLET (81 MG TOTAL) BY MOUTH DAILY.   Multiple Vitamins-Minerals (MULTIVITAMIN WITH MINERALS) tablet Take 1 tablet by mouth daily.     Allergies:   Patient has no known allergies.   Social History   Socioeconomic History   Marital status: Single    Spouse name: Not on file   Number of children: Not on file   Years of education: Not on file   Highest education level: Not on file  Occupational History   Not on file  Tobacco Use   Smoking status: Never   Smokeless tobacco: Never  Vaping Use   Vaping status: Never Used  Substance and Sexual Activity   Alcohol use: Yes    Alcohol/week: 2.0 standard drinks of alcohol    Types: 2 Standard drinks or equivalent per week    Comment: casual   Drug use: Never   Sexual activity: Yes    Partners: Male  Other Topics Concern   Not on file  Social History Narrative   Works as an Chiropodist in Retail   2 children (both daughters) 29 and 69   Divorced   Dog and a Medical laboratory scientific officer  Enjoys cayaking, hiking, reading/cooking.   Completed college   Grew up in [place]- moved her in her mid 20's   Social Determinants of Health   Financial Resource Strain: Not on [employer] Insecurity: Not on file  Transportation Needs: Not on file  Physical Activity: Not on file  Stress: Not on file  Social Connections: Not on file     Family History: The patient's family history includes Cancer (age of onset: 58) in her father; Diabetes type I in her daughter. There is no history of Colon cancer, Esophageal cancer, Colon polyps, Rectal cancer, or Stomach cancer.  ROS:   Please see the history of present illness.     All other systems reviewed and are negative.  EKGs/Labs/Other Studies Reviewed:    The following studies were reviewed today: EKG Interpretation Date/Time:  Monday October 19 2022 10:32:16 EDT Ventricular Rate:  66 PR Interval:  188 QRS Duration:  96 QT Interval:  412 QTC Calculation: 431 R Axis:   50  Text Interpretation: Sinus rhythm with sinus arrhythmia with occasional Premature ventricular complexes Non-specific ST-t changes Confirmed by Tiffany Gardner [phone]) on 10/19/2022 10:47:50 AM    Recent Labs: No results found for requested labs within last 365 days.  Recent Lipid Panel    Component Value Date/Time   CHOL 201 (H) 12/02/2021 0737   TRIG 213 (H) 12/02/2021 0737   HDL 42 12/02/2021 0737   CHOLHDL 4.8 (H) 12/02/2021 0737   CHOLHDL 3.8 11/14/2019 1008   VLDL 31.0 07/05/2017 1352   LDLCALC 121 (H) 12/02/2021 0737   LDLCALC 102 (H) 11/14/2019 1008     Risk Assessment/Calculations:                Physical Exam:    VS:  BP 116/84   Pulse 72   Ht 5\' 10"  (1.778 m)   Wt 188 lb 9.6 oz (85.5 kg)   LMP 02/24/2015   SpO2 95%   BMI 27.06 kg/m     Wt Readings from Last 3 Encounters:  10/19/22 188 lb 9.6 oz (85.5 kg)  10/13/21 204 lb (92.5 kg)  07/11/21 200 lb 12.8 oz (91.1 kg)     GEN:  Well nourished, well developed in no acute distress HEENT: Normal NECK: No JVD; No carotid bruits LYMPHATICS: No lymphadenopathy CARDIAC: RRR, no murmurs, rubs, gallops RESPIRATORY:  Clear to auscultation without rales, wheezing or rhonchi  ABDOMEN: Soft, non-tender, non-distended MUSCULOSKELETAL:  No edema; No deformity  SKIN: Warm and dry NEUROLOGIC:  Alert and oriented x 3 PSYCHIATRIC:  Normal affect   ASSESSMENT:    1. Mixed hyperlipidemia   2. ASD (atrial septal defect), ostium secundum    PLAN:    In order of problems listed above:  Off of all medications.  Wants to continue with lifestyle modification, healthy diet, and exercise.  Not interested in  further lipid-lowering therapies. Patient with a complex multi fenestrated ASD status post transcatheter closure 8 years ago.  As above, noted to have some residual flow across the inferior aspect of the septum.  She was noted to have normal right atrial and right ventricular size at the time of her echo.  She has opted for medical therapy with aspirin.  I have recommended a repeat echocardiogram in 1 year for surveillance.  No other changes made today.           Medication Adjustments/Labs and Tests Ordered: Current medicines are reviewed at length with the patient today.  Concerns regarding medicines are  outlined above.  Orders Placed This Encounter  Procedures   EKG 12-Lead   ECHOCARDIOGRAM COMPLETE   No orders of the defined types were placed in this encounter.   Patient Instructions  Medication Instructions:  Your physician recommends that you continue on your current medications as directed. Please refer to the Current Medication list given to you today.  *If you need a refill on your cardiac medications before your next appointment, please call your pharmacy*  Lab Work: NONE If you have labs (blood work) drawn today and your tests are completely normal, you will receive your results only by: MyChart Message (if you have MyChart) OR A paper copy in the mail If you have any lab test that is abnormal or we need to change your treatment, we will call you to review the results.  Testing/Procedures: ECHO (prior to next visit) Your physician has requested that you have an echocardiogram. Echocardiography is a painless test that uses sound waves to create images of your heart. It provides your doctor with information about the size and shape of your heart and how well your heart's chambers and valves are working. This procedure takes approximately one hour. There are no restrictions for this procedure. Please do NOT wear cologne, perfume, aftershave, or lotions (deodorant is  allowed). Please arrive 15 minutes prior to your appointment time.  Follow-Up: At [facility], you and your health needs are our priority.  As part of our continuing mission to provide you with exceptional heart care, we have created designated Provider Care Teams.  These Care Teams include your primary Cardiologist (physician) and Advanced Practice Providers (APPs -  Physician Assistants and Nurse Practitioners) who all work together to provide you with the care you need, when you need it.  Your next appointment:   1 year(s)  Provider:   Tonny Bollman, MD        Signed, Tiffany Bollman, MD  10/19/2022 11:38 AM    [facility]

## 2022-12-03 ENCOUNTER — Ambulatory Visit: Payer: BC Managed Care – PPO | Admitting: Nurse Practitioner

## 2023-04-26 ENCOUNTER — Ambulatory Visit (INDEPENDENT_AMBULATORY_CARE_PROVIDER_SITE_OTHER): Payer: BC Managed Care – PPO | Admitting: Family

## 2023-04-26 ENCOUNTER — Encounter: Payer: Self-pay | Admitting: Family

## 2023-04-26 VITALS — BP 111/56 | HR 71 | Temp 99.0°F | Resp 16 | Ht 70.0 in | Wt 186.0 lb

## 2023-04-26 DIAGNOSIS — Z0000 Encounter for general adult medical examination without abnormal findings: Secondary | ICD-10-CM

## 2023-04-26 DIAGNOSIS — Z1231 Encounter for screening mammogram for malignant neoplasm of breast: Secondary | ICD-10-CM

## 2023-04-26 DIAGNOSIS — E782 Mixed hyperlipidemia: Secondary | ICD-10-CM

## 2023-04-26 DIAGNOSIS — M25562 Pain in left knee: Secondary | ICD-10-CM | POA: Diagnosis not present

## 2023-04-26 DIAGNOSIS — M25561 Pain in right knee: Secondary | ICD-10-CM | POA: Diagnosis not present

## 2023-04-26 LAB — LIPID PANEL
Cholesterol: 269 mg/dL — ABNORMAL HIGH (ref 0–200)
HDL: 52.7 mg/dL (ref >39.00)
LDL Cholesterol: 192 mg/dL — ABNORMAL HIGH (ref 0–99)
NonHDL: 216.09
Total CHOL/HDL Ratio: 5
Triglycerides: 118 mg/dL (ref 0.0–149.0)
VLDL: 23.6 mg/dL (ref 0.0–40.0)

## 2023-04-26 LAB — COMPREHENSIVE METABOLIC PANEL
ALT: 18 U/L (ref 0–35)
AST: 17 U/L (ref 0–37)
Albumin: 4.4 g/dL (ref 3.5–5.2)
Alkaline Phosphatase: 96 U/L (ref 39–117)
BUN: 13 mg/dL (ref 6–23)
CO2: 27 meq/L (ref 19–32)
Calcium: 9.8 mg/dL (ref 8.4–10.5)
Chloride: 103 meq/L (ref 96–112)
Creatinine, Ser: 0.8 mg/dL (ref 0.40–1.20)
GFR: 84.42 mL/min (ref >60.00)
Glucose, Bld: 99 mg/dL (ref 70–99)
Potassium: 4.2 meq/L (ref 3.5–5.1)
Sodium: 138 meq/L (ref 135–145)
Total Bilirubin: 0.8 mg/dL (ref 0.2–1.2)
Total Protein: 7.3 g/dL (ref 6.0–8.3)

## 2023-04-26 NOTE — Assessment & Plan Note (Signed)
 Pt is requesting referral to orthopedics.  Advised pt on trial of OTC voltaren gel.

## 2023-04-26 NOTE — Patient Instructions (Addendum)
 VISIT SUMMARY:  Today, we discussed your knee pain, which has been worsening with physical activity and prolonged sitting. We also reviewed your decision to manage your cholesterol through diet and exercise after experiencing side effects from statins. Additionally, we talked about your upcoming cervical cancer screening and the need for a mammogram. Routine health maintenance was also addressed.  YOUR PLAN:  -KNEE PAIN: Knee pain can be caused by various factors, including arthritis, which is the inflammation of the joints. We discussed the potential for arthritis and possible treatment options, including joint injections and using Voltaren gel. You are referred to [facility] for further evaluation and management. Please try using over-the-counter Voltaren gel applied twice daily.  -HYPERLIPIDEMIA: Hyperlipidemia is a condition where there are high levels of fats (lipids) in the blood. Since you have discontinued statins due to side effects, we will check your lipid panel today to assess your current status. Continue focusing on diet and exercise to manage your cholesterol levels.  -CERVICAL CANCER SCREENING: Cervical cancer screening involves testing for precancerous or cancerous cells on the cervix. You plan to have a Pap smear in [place] in April. Please request a copy of the report to update your chart.  -MAMMOGRAM: A mammogram is an X-ray of the breast used to screen for breast cancer. You are due for a screening, and we have ordered a mammogram at the breast center.  -GENERAL HEALTH MAINTENANCE: We reviewed your routine health maintenance. Your colonoscopy is up to date as of 2023, and your vision and dental check-ups are current. We will check your kidney and liver function today.  INSTRUCTIONS:  Please follow up with [facility] for your knee pain evaluation. Use Voltaren gel on your knees twice daily. Continue with your diet and exercise plan for managing cholesterol, and get your lipid  panel checked today. Request a copy of your Pap smear report after your screening in [place] for our records. Schedule your mammogram at the breast center. We will also check your kidney and liver function today.

## 2023-04-26 NOTE — Progress Notes (Signed)
 Subjective:     Patient ID: Tiffany Gardner, female    DOB: February 22, 1971, 53 y.o.   MRN: [id number]  Chief Complaint  Patient presents with   Annual Exam    HPI  Discussed the use of AI scribe software for clinical note transcription with the patient, who gave verbal consent to proceed.  History of Present Illness  Tiffany Gardner is a 53 year old female who presents for CPE.   She experiences increased joint pain in her knees, particularly exacerbated by playing pickleball, which she started last year. The pain is described as feeling like 'an old door that needs some WD forty,' especially in the mornings and after sitting for long periods. She recalls a history of knee issues in her twenties but has been fine since avoiding activities like tennis and skiing. Recently, her right knee gave out completely on two occasions, but improved after a couple of days of rest. She attributes some of the pain to long work shifts, during which she walks 25,000 to 30,000 steps a day.  She is no longer taking statins due to severe side effects, including cramps and fatigue, which persisted even with injections. She has decided to focus on diet and exercise to manage her cholesterol.  There have been no changes in her family medical history, and her mother has no health issues. She consumes alcohol occasionally, does not use drugs, and has a female partner. She does not smoke or use tobacco. No current cough, cold symptoms, skin concerns, swelling in her legs, digestive issues, urinary problems, frequent headaches, or concerns about depression or anxiety. She notes a significant weight loss since 2023, when she weighed 204 pounds. Her hearing and vision are generally okay, though she sometimes needs reading glasses or better lighting.  Immunizations: declines flu shot Diet: healthy Exercise: hiking, walking, yoga Colonoscopy: 2023, due 2033 Pap Smear: up to date in [place] Mammogram: due Vision: up to date Dental:  up to date      Health Maintenance Due  Topic Date Due   Hepatitis C Screening  Never done   DTaP/Tdap/Td (1 - Tdap) Never done   Cervical Cancer Screening (HPV/Pap Cotest)  02/23/2018   Zoster Vaccines- Shingrix (1 of 2) Never done   MAMMOGRAM  06/06/2022   COVID-19 Vaccine (3 - 2024-25 season) 10/25/2022    Past Medical History:  Diagnosis Date   Acute CVA (cerebrovascular accident) (HCC) 01/23/2015   Acute embolic stroke (HCC) 2016   Atrial septal defect 2016   corrected with sx   Hyperlipidemia 01/22/2015   on meds   TIA (transient ischemic attack) 01/22/2015    Past Surgical History:  Procedure Laterality Date   CARDIAC CATHETERIZATION N/A 02/11/2015   Procedure: ASD Closure;  Surgeon: Tonny Bollman, MD;  Location: [facility] INVASIVE CV LAB;  Service: Cardiovascular;  Laterality: N/A;   TEE WITHOUT CARDIOVERSION N/A 01/24/2015   Procedure: TRANSESOPHAGEAL ECHOCARDIOGRAM (TEE);  Surgeon: Chrystie Nose, MD;  Location: [facility] ENDOSCOPY;  Service: Cardiovascular;  Laterality: N/A;    Family History  Problem Relation Age of Onset   Cancer Father 50       throat CA/smoker   Diabetes type I Daughter    Colon cancer Neg Hx    Esophageal cancer Neg Hx    Colon polyps Neg Hx    Rectal cancer Neg Hx    Stomach cancer Neg Hx     Social History   Socioeconomic History   Marital status: Single    Spouse  name: Not on file   Number of children: Not on file   Years of education: Not on file   Highest education level: Associate degree: occupational, Scientist, product/process development, or vocational program  Occupational History   Not on file  Tobacco Use   Smoking status: Never   Smokeless tobacco: Never  Vaping Use   Vaping status: Never Used  Substance and Sexual Activity   Alcohol use: Yes    Alcohol/week: 2.0 standard drinks of alcohol    Types: 2 Standard drinks or equivalent per week    Comment: casual   Drug use: Never   Sexual activity: Yes    Partners: Male  Other Topics Concern    Not on file  Social History Narrative   Works as an Chiropodist in Retail   2 children (both daughters) 29 and 54   Divorced   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   Completed college   Grew up in [place]- moved her in her mid 20's   Social Drivers of Health   Financial Resource Strain: Low Risk  (04/22/2023)   Overall Financial Resource Strain (CARDIA)    Difficulty of Paying Living Expenses: Not very hard  Food Insecurity: No Food Insecurity (04/22/2023)   Hunger Vital Sign    Worried About Running Out of Food in the Last Year: Never true    Ran Out of Food in the Last Year: Never true  Transportation Needs: No Transportation Needs (04/22/2023)   PRAPARE - Administrator, Civil Service (Medical): No    Lack of Transportation (Non-Medical): No  Physical Activity: Sufficiently Active (04/22/2023)   Exercise Vital Sign    Days of Exercise per Week: 4 days    Minutes of Exercise per Session: 40 min  Stress: No Stress Concern Present (04/22/2023)   [employer] of Occupational Health - Occupational Stress Questionnaire    Feeling of Stress : Not at all  Social Connections: Moderately Isolated (04/22/2023)   Social Connection and Isolation Panel [NHANES]    Frequency of Communication with Friends and Family: More than three times a week    Frequency of Social Gatherings with Friends and Family: Twice a week    Attends Religious Services: Never    Database administrator or Organizations: Yes    Attends Engineer, structural: More than 4 times per year    Marital Status: Divorced  Catering manager Violence: Not on file    Outpatient Medications Prior to Visit  Medication Sig Dispense Refill   [facility] ASPIRIN ADULT LOW DOSE 81 MG chewable tablet CHEW 1 TABLET (81 MG TOTAL) BY MOUTH DAILY. 30 tablet 0   Multiple Vitamins-Minerals (MULTIVITAMIN WITH MINERALS) tablet Take 1 tablet by mouth daily.     No facility-administered medications prior to  visit.    No Known Allergies  Review of Systems  Constitutional:  Negative for weight loss.  HENT:  Negative for congestion and hearing loss.   Eyes:  Negative for blurred vision.  Respiratory:  Negative for cough.   Cardiovascular:  Negative for leg swelling.  Gastrointestinal:  Negative for constipation and diarrhea.  Genitourinary:  Negative for dysuria, frequency and hematuria.  Musculoskeletal:  Positive for joint pain.  Skin:  Negative for rash.  Neurological:  Negative for headaches.  Psychiatric/Behavioral:         Denies depression/anxiety       Objective:    Physical Exam   BP (!) 111/56 (BP Location: Left  Arm, Patient Position: Sitting, Cuff Size: Normal)   Pulse 71   Temp 99 F (37.2 C) (Oral)   Resp 16   Ht 5\' 10"  (1.778 m)   Wt 186 lb (84.4 kg)   LMP 02/24/2015   SpO2 99%   BMI 26.69 kg/m  Wt Readings from Last 3 Encounters:  04/26/23 186 lb (84.4 kg)  10/19/22 188 lb 9.6 oz (85.5 kg)  10/13/21 204 lb (92.5 kg)   Physical Exam  Constitutional: She is oriented to person, place, and time. She appears well-developed and well-nourished. No distress.  HENT:  Head: Normocephalic and atraumatic.  Right Ear: Tympanic membrane and ear canal normal.  Left Ear: Tympanic membrane and ear canal normal.  Mouth/Throat: Oropharynx is clear and moist.  Eyes: Pupils are equal, round, and reactive to light. No scleral icterus.  Neck: Normal range of motion. No thyromegaly present.  Cardiovascular: Normal rate and regular rhythm.   No murmur heard. Pulmonary/Chest: Effort normal and breath sounds normal. No respiratory distress. He has no wheezes. She has no rales. She exhibits no tenderness.  Abdominal: Soft. Bowel sounds are normal. She exhibits no distension and no mass. There is no tenderness. There is no rebound and no guarding.  Musculoskeletal: She exhibits no edema.  Lymphadenopathy:    She has no cervical adenopathy.  Neurological: She is alert and  oriented to person, place, and time. She has 1+ patellar reflexes. She exhibits normal muscle tone. Coordination normal.  Skin: Skin is warm and dry.  Psychiatric: She has a normal mood and affect. Her behavior is normal. Judgment and thought content normal.  Breast/pelvic: deferred        Assessment & Plan:       Assessment & Plan:   Problem List Items Addressed This Visit       Unprioritized   Hyperlipidemia (Chronic)   Intolerant to statins and Repatha. She wishes to focus on diet instead of medication.       Relevant Orders   Comp Met (CMET)   Lipid panel   Preventative health care    Patient plans to have Pap smear in [place] in April. -Request copy of report to update patient's chart.  Mammogram Due for screening. -Order mammogram at the breast center.  General Health Maintenance -Flu shot declined. -Colonoscopy up to date (2023). -Vision and dental up to date. -Check kidney and liver function today.      Pain in both knees - Primary   Pt is requesting referral to orthopedics.  Advised pt on trial of OTC voltaren gel.      Relevant Orders   Ambulatory referral to Orthopedic Surgery   Other Visit Diagnoses       Breast cancer screening by mammogram       Relevant Orders   MM 3D SCREENING MAMMOGRAM BILATERAL BREAST       I am having Tommi Rumps maintain her multivitamin with minerals and [facility]S Aspirin Adult Low Dose.  No orders of the defined types were placed in this encounter.

## 2023-04-26 NOTE — Assessment & Plan Note (Signed)
 Intolerant to statins and Repatha. She wishes to focus on diet instead of medication.

## 2023-04-26 NOTE — Assessment & Plan Note (Signed)
  Patient plans to have Pap smear in [place] in April. -Request copy of report to update patient's chart.  Mammogram Due for screening. -Order mammogram at the breast center.  General Health Maintenance -Flu shot declined. -Colonoscopy up to date (2023). -Vision and dental up to date. -Check kidney and liver function today.

## 2023-05-14 DIAGNOSIS — M25561 Pain in right knee: Secondary | ICD-10-CM | POA: Diagnosis not present

## 2023-05-14 DIAGNOSIS — M25562 Pain in left knee: Secondary | ICD-10-CM | POA: Diagnosis not present

## 2023-05-18 ENCOUNTER — Ambulatory Visit

## 2023-06-11 ENCOUNTER — Ambulatory Visit
Admission: RE | Admit: 2023-06-11 | Discharge: 2023-06-11 | Disposition: A | Discharge status: Home / Self Care | Source: Ambulatory Visit | Attending: Family | Admitting: Family

## 2023-06-11 DIAGNOSIS — Z1231 Encounter for screening mammogram for malignant neoplasm of breast: Secondary | ICD-10-CM

## 2023-08-12 ENCOUNTER — Encounter: Payer: Self-pay | Admitting: Family

## 2023-09-20 ENCOUNTER — Other Ambulatory Visit: Payer: Self-pay | Admitting: *Deleted

## 2023-09-20 DIAGNOSIS — Q2111 Secundum atrial septal defect: Secondary | ICD-10-CM

## 2023-09-20 NOTE — Progress Notes (Signed)
 New echo order placed in the system for the pt, at the request of our echo scheduler.

## 2023-11-03 ENCOUNTER — Ambulatory Visit (HOSPITAL_COMMUNITY)
Admission: RE | Admit: 2023-11-03 | Discharge: 2023-11-03 | Disposition: A | Discharge status: Home / Self Care | Source: Ambulatory Visit | Attending: Cardiovascular Disease | Admitting: Cardiovascular Disease

## 2023-11-03 ENCOUNTER — Ambulatory Visit: Payer: Self-pay | Admitting: Cardiovascular Disease

## 2023-11-03 DIAGNOSIS — Q2111 Secundum atrial septal defect: Secondary | ICD-10-CM | POA: Diagnosis not present

## 2023-11-03 LAB — ECHOCARDIOGRAM COMPLETE
AR max vel: 2.7 cm2
AV Area VTI: 2.87 cm2
AV Area mean vel: 2.54 cm2
AV Mean grad: 3 mmHg
AV Peak grad: 4.8 mmHg
Ao pk vel: 1.09 m/s
Area-P 1/2: 3.06 cm2
S' Lateral: 3.5 cm

## 2024-01-03 NOTE — Progress Notes (Unsigned)
 Cardiology Office Note:    Date:  01/04/2024   ID:  Tiffany Gardner, DOB 10/12/1970, MRN [id number]  PCP:  Daryl Setter, NP   [facility] Providers Cardiologist:  Ozell Fell, MD     Referring MD: Daryl Setter, NP   Chief Complaint  Patient presents with   Follow-up    ASD    History of Present Illness:    Tiffany Gardner is a 53 y.o. 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  She has done well on antiplatelet therapy with aspirin  since her ASD closure procedure with no recurrent events.  She underwent a surveillance echocardiogram a few years ago that showed some residual flow across the inferior portion of the septum, but the patient declined further evaluation with transesophageal echo at that time.  An echocardiogram was performed 11/03/2023 and demonstrated LVEF 60 to 65%, normal RV function, and no significant flow around the device is or across the interatrial septum was seen on this study.  The patient is here alone today.  She is doing very well and remains physically active with no exertional symptoms.  She plays pickle ball regularly, hikes, and is very active.  She denies chest pain, heart palpitations, shortness of breath, or leg swelling.  No lightheadedness or dizziness.   Current Medications: Current Meds  Medication Sig   [facility] ASPIRIN  ADULT LOW DOSE 81 MG chewable tablet CHEW 1 TABLET (81 MG TOTAL) BY MOUTH DAILY.   Multiple Vitamins-Minerals (MULTIVITAMIN WITH MINERALS) tablet Take 1 tablet by mouth daily.     Allergies:   Patient has no known allergies.   ROS:   Please see the history of present illness.    All other systems reviewed and are negative.  EKGs/Labs/Other Studies Reviewed:    The following studies were reviewed today: Cardiac Studies & Procedures    ______________________________________________________________________________________________ CARDIAC CATHETERIZATION  CARDIAC CATHETERIZATION 02/11/2015  Conclusion Successful Transcatheter Device Closure of a Multi-fenestrated Secundum ASD     ECHOCARDIOGRAM  ECHOCARDIOGRAM COMPLETE 11/03/2023  Narrative ECHOCARDIOGRAM REPORT    Patient Name:   Tiffany Gardner     Date of Exam: 11/03/2023 Medical Rec #:  [id number]     Height:       70.0 in Accession #:    [id number]    Weight:       186.0 lb Date of Birth:  28-Sep-1970     BSA:          2.024 m Patient Age:    53 years      BP:           122/66 mmHg Patient Gender: F             HR:           61 bpm. Exam Location:  [address]  Procedure: 2D Echo, Cardiac Doppler, Color Doppler and Strain Analysis (Both Spectral and Color Flow Doppler were utilized during procedure).  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 TIA, Stroke and Embolic stroke; Risk Factors:Hyperlipidemia.  Sonographer:    Cherene Ravens RDCS Referring Phys: [phone] Trammell Bowden  IMPRESSIONS   1. Left ventricular ejection fraction, by estimation, is 60 to 65%. The left ventricle has normal function. The left ventricle has no regional wall motion abnormalities. Left ventricular diastolic parameters were normal. The average  left ventricular global longitudinal strain is -18.6 %. The global longitudinal strain is normal. 2. Right ventricular systolic function is normal. The right ventricular size is normal. 3. The mitral valve is normal in structure. No evidence of mitral valve regurgitation. No evidence of mitral stenosis. 4. The aortic valve is normal in structure. Aortic valve regurgitation is not visualized. No aortic stenosis is present. 5. The inferior vena cava is normal in size with greater than 50% respiratory variability, suggesting right atrial pressure of 3 mmHg. 6. The  interatrial septum is s/p ASD closure with two cribriform occluder devices. Previously noted residual flow at the inferior rim is not as well visualized. No significant changes compared to prior study otherwise.  FINDINGS Left Ventricle: Left ventricular ejection fraction, by estimation, is 60 to 65%. The left ventricle has normal function. The left ventricle has no regional wall motion abnormalities. The average left ventricular global longitudinal strain is -18.6 %. Strain was performed and the global longitudinal strain is normal. The left ventricular internal cavity size was normal in size. There is no left ventricular hypertrophy. Left ventricular diastolic parameters were normal.  Right Ventricle: The right ventricular size is normal. No increase in right ventricular wall thickness. Right ventricular systolic function is normal.  Left Atrium: Left atrial size was normal in size.  Right Atrium: Right atrial size was normal in size.  Pericardium: There is no evidence of pericardial effusion.  Mitral Valve: The mitral valve is normal in structure. No evidence of mitral valve regurgitation. No evidence of mitral valve stenosis.  Tricuspid Valve: The tricuspid valve is normal in structure. Tricuspid valve regurgitation is trivial. No evidence of tricuspid stenosis.  Aortic Valve: The aortic valve is normal in structure. Aortic valve regurgitation is not visualized. No aortic stenosis is present. Aortic valve mean gradient measures 3.0 mmHg. Aortic valve peak gradient measures 4.8 mmHg. Aortic valve area, by VTI measures 2.87 cm.  Pulmonic Valve: The pulmonic valve was normal in structure. Pulmonic valve regurgitation is trivial. No evidence of pulmonic stenosis.  Aorta: The aortic root is normal in size and structure.  Venous: The inferior vena cava is normal in size with greater than 50% respiratory variability, suggesting right atrial pressure of 3 mmHg.  IAS/Shunts: The interatrial  septum was not well visualized.   LEFT VENTRICLE PLAX 2D LVIDd:         5.10 cm   Diastology LVIDs:         3.50 cm   LV e' medial:    9.79 cm/s LV PW:         0.70 cm   LV E/e' medial:  6.4 LV IVS:        0.80 cm   LV e' lateral:   14.80 cm/s LVOT diam:     2.10 cm   LV E/e' lateral: 4.2 LV SV:         71 LV SV Index:   35        2D Longitudinal Strain LVOT Area:     3.46 cm  2D Strain GLS (A4C):   -14.1 % 2D Strain GLS (A3C):   -20.3 % 2D Strain GLS (A2C):   -21.6 % 2D Strain GLS Avg:     -18.6 %  RIGHT VENTRICLE             IVC RV Basal diam:  3.70 cm     IVC diam: 2.30 cm RV Mid diam:    2.70 cm RV S prime:  15.70 cm/s TAPSE (M-mode): 3.0 cm RVSP:           24.6 mmHg  LEFT ATRIUM             Index        RIGHT ATRIUM           Index LA diam:        4.00 cm 1.98 cm/m   RA Pressure: 8.00 mmHg LA Vol (A2C):   65.0 ml 32.12 ml/m  RA Area:     19.70 cm LA Vol (A4C):   60.5 ml 29.89 ml/m  RA Volume:   64.10 ml  31.67 ml/m LA Biplane Vol: 64.7 ml 31.97 ml/m AORTIC VALVE AV Area (Vmax):    2.70 cm AV Area (Vmean):   2.54 cm AV Area (VTI):     2.87 cm AV Vmax:           109.00 cm/s AV Vmean:          75.600 cm/s AV VTI:            0.247 m AV Peak Grad:      4.8 mmHg AV Mean Grad:      3.0 mmHg LVOT Vmax:         84.90 cm/s LVOT Vmean:        55.500 cm/s LVOT VTI:          0.205 m LVOT/AV VTI ratio: 0.83  AORTA Ao Root diam: 3.00 cm Ao Asc diam:  3.00 cm  MITRAL VALVE               TRICUSPID VALVE MV Area (PHT): 3.06 cm    TR Peak grad:   16.6 mmHg MV Decel Time: 248 msec    TR Vmax:        204.00 cm/s MV E velocity: 62.40 cm/s  Estimated RAP:  8.00 mmHg MV A velocity: 60.30 cm/s  RVSP:           24.6 mmHg MV E/A ratio:  1.03 SHUNTS Systemic VTI:  0.20 m Systemic Diam: 2.10 cm  Aditya Sabharwal Electronically signed by Ria Commander Signature Date/Time: 11/03/2023/11:42:53 AM    Final   TEE  ECHO TEE 01/24/2015  Narrative *[facility]* *[facility]* [address] [place], [place] [address] [phone]  ------------------------------------------------------------------- Transesophageal Echocardiography  Patient:    Zamiyah, Resendes MR #:       [id number] Study Date: 01/24/2015 Gender:     F Age:        44 Height:     175.3 cm Weight:     88.2 kg BSA:        2.09 m^2 Pt. Status: Room:       5M22C  TISA Maxie Herlene MARLA REFERRING    Maxie Herlene K ATTENDING    Magness, Trenda D ADMITTING    [place], [place] A PERFORMING   Vinie Maxcy MD SONOGRAPHER  Jimmy Reel, RDCS  cc:  ------------------------------------------------------------------- LV EF: 55% -   60%  ------------------------------------------------------------------- Indications:      CVA 436.  ------------------------------------------------------------------- Study Conclusions  - Left ventricle: The cavity size was normal. Wall thickness was normal. Systolic function was normal. The estimated ejection fraction was in the range of 55% to 60%. Wall motion was normal; there were no regional wall motion abnormalities. - Aortic valve: No evidence of vegetation. - Mitral valve: No evidence of vegetation. - Left atrium: No evidence of thrombus in the atrial cavity  or appendage. - Right ventricle: Mildly dilated. - Right atrium: The atrium was dilated. 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 There are 2 separate secundum atrial septal defects, with moderate left to right flow by color doppler seen in 2D and 3D imaging. They are in the inferior and medial portions of the atrial septum. Right to left flow by saline microbubble contrast was not noted, even with valsalva. - Pulmonic valve: No evidence of vegetation.  Impressions:  - Atrial septal aneurysm with secundum ASD- there are signs of right heart enlargement. Consider percutaneous or surgical closure evaluation.  Diagnostic  transesophageal echocardiography.  2D and color Doppler. Birthdate:  Patient birthdate: January 30, 1971.  Age:  Patient is 53 yr old.  Sex:  Gender: female.    BMI: 28.7 kg/m^2.  Blood pressure: 121/63  Patient status:  Inpatient.  Study date:  Study date: 01/24/2015. Study time: 08:09 AM.  Location:  Endoscopy.  -------------------------------------------------------------------  ------------------------------------------------------------------- Left ventricle:  The cavity size was normal. Wall thickness was normal. Systolic function was normal. The estimated ejection fraction was in the range of 55% to 60%. Wall motion was normal; there were no regional wall motion abnormalities.  ------------------------------------------------------------------- Aortic valve:   Structurally normal valve. Trileaflet. Cusp separation was normal.  No evidence of vegetation.  Doppler:  There was no regurgitation.  ------------------------------------------------------------------- Aorta:  The aorta was normal, not dilated, and non-diseased.  ------------------------------------------------------------------- Mitral valve:   Structurally normal valve.   Leaflet separation was normal.  No evidence of vegetation.  Doppler:  There was trivial regurgitation.  ------------------------------------------------------------------- Left atrium:  The atrium was normal in size.  No evidence of thrombus in the atrial cavity or appendage.  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 There are 2 separate secundum atrial septal defects, with moderate left to right flow by color doppler seen in 2D and 3D imaging. They are in the inferior and medial portions of the atrial septum. Right to left flow by saline microbubble contrast was not noted, even  with valsalva.  ------------------------------------------------------------------- Pulmonary veins:  No anomaly.  ------------------------------------------------------------------- Right ventricle:  Mildly dilated.  ------------------------------------------------------------------- Pulmonic valve:    Structurally normal valve.   Cusp separation was normal.  No evidence of vegetation.  ------------------------------------------------------------------- Tricuspid valve:   Doppler:  There was trivial regurgitation.  ------------------------------------------------------------------- Pulmonary artery:   The main pulmonary artery was normal-sized.  ------------------------------------------------------------------- Right atrium:  The atrium was dilated.  ------------------------------------------------------------------- Pericardium:  There was no pericardial effusion.  ------------------------------------------------------------------- Post procedure conclusions Ascending Aorta:  - The aorta was normal, not dilated, and non-diseased.  ------------------------------------------------------------------- Prepared and Electronically Authenticated by  Vinie Maxcy MD 2016-12-01T12:34:26        ______________________________________________________________________________________________      EKG:   EKG Interpretation Date/Time:  Tuesday January 04 2024 08:14:49 EST Ventricular Rate:  78 PR Interval:  184 QRS Duration:  84 QT Interval:  422 QTC Calculation: 481 R Axis:   42  Text Interpretation: Sinus rhythm with Premature ventricular complexes Cannot rule out Anterior infarct , age undetermined When compared with ECG of 19-Oct-2022 10:32, PVC's are more frequent Confirmed by Wonda Sharper [phone]) on 01/04/2024 8:24:57 AM    Recent Labs: 04/26/2023: ALT 18; BUN 13; Creatinine, Ser 0.80; Potassium 4.2; Sodium 138  Recent Lipid Panel    Component Value Date/Time    CHOL 269 (H) 04/26/2023 1127   CHOL 201 (H) 12/02/2021 0737   TRIG 118.0 04/26/2023 1127   HDL 52.70  04/26/2023 1127   HDL 42 12/02/2021 0737   CHOLHDL 5 04/26/2023 1127   VLDL 23.6 04/26/2023 1127   LDLCALC 192 (H) 04/26/2023 1127   LDLCALC 121 (H) 12/02/2021 0737   LDLCALC 102 (H) 11/14/2019 1008     Risk Assessment/Calculations:                Physical Exam:    VS:  BP 114/82 (BP Location: Right Arm, Patient Position: Sitting, Cuff Size: Normal)   Pulse 78   Ht 5' 10 (1.778 m)   Wt 176 lb (79.8 kg)   LMP 02/24/2015   SpO2 97%   BMI 25.25 kg/m     Wt Readings from Last 3 Encounters:  01/04/24 176 lb (79.8 kg)  04/26/23 186 lb (84.4 kg)  10/19/22 188 lb 9.6 oz (85.5 kg)     GEN:  Well nourished, well developed in no acute distress HEENT: Normal NECK: No JVD; No carotid bruits LYMPHATICS: No lymphadenopathy CARDIAC: RRR, no murmurs, rubs, gallops RESPIRATORY:  Clear to auscultation without rales, wheezing or rhonchi  ABDOMEN: Soft, non-tender, non-distended MUSCULOSKELETAL:  No edema; No deformity  SKIN: Warm and dry NEUROLOGIC:  Alert and oriented x 3 PSYCHIATRIC:  Normal affect   Assessment & Plan ASD (atrial septal defect), ostium secundum Status post transcatheter closure with 2 atrial septal occluder devices.  Patient now 9 years out from her procedure and doing well clinically.  This year's echo is reviewed and shows stable position of her atrial septal occluder devices with no clear flow across the devices.  Patient remains on aspirin  81 mg daily. Mixed hyperlipidemia Patient has declined pharmacotherapy.  Discussed again today.  Continue with diet and lifestyle modification.    Patient doing very well, now almost a decade out from transcatheter ASD closure.  I will see her back in 2 years for clinical follow-up.  She does not require any further routine imaging at this point. Medication Adjustments/Labs and Tests Ordered: Current medicines are  reviewed at length with the patient today.  Concerns regarding medicines are outlined above.  Orders Placed This Encounter  Procedures   EKG 12-Lead   No orders of the defined types were placed in this encounter.   There are no Patient Instructions on file for this visit.   Signed, Ozell Fell, MD  01/04/2024 8:25 AM    [facility]

## 2024-01-04 ENCOUNTER — Encounter: Payer: Self-pay | Admitting: Cardiovascular Disease

## 2024-01-04 ENCOUNTER — Ambulatory Visit: Attending: Cardiovascular Disease | Admitting: Cardiovascular Disease

## 2024-01-04 VITALS — BP 114/82 | HR 78 | Ht 70.0 in | Wt 176.0 lb

## 2024-01-04 DIAGNOSIS — Q2111 Secundum atrial septal defect: Secondary | ICD-10-CM | POA: Diagnosis not present

## 2024-01-04 DIAGNOSIS — E782 Mixed hyperlipidemia: Secondary | ICD-10-CM

## 2024-01-04 NOTE — Patient Instructions (Signed)
 Medication Instructions:  No medication changes were made at this visit. Continue current regimen.   *If you need a refill on your cardiac medications before your next appointment, please call your pharmacy*  Lab Work: None ordered today. If you have labs (blood work) drawn today and your tests are completely normal, you will receive your results only by: MyChart Message (if you have MyChart) OR A paper copy in the mail If you have any lab test that is abnormal or we need to change your treatment, we will call you to review the results.  Testing/Procedures: None ordered today.  Follow-Up: At [facility], you and your health needs are our priority.  As part of our continuing mission to provide you with exceptional heart care, our providers are all part of one team.  This team includes your primary Cardiologist (physician) and Advanced Practice Providers or APPs (Physician Assistants and Nurse Practitioners) who all work together to provide you with the care you need, when you need it.  Your next appointment:   2 year(s)  Provider:   Ozell Fell, MD

## 2024-01-04 NOTE — Assessment & Plan Note (Signed)
 Patient has declined pharmacotherapy.  Discussed again today.  Continue with diet and lifestyle modification.

## 2024-01-04 NOTE — Assessment & Plan Note (Signed)
 Status post transcatheter closure with 2 atrial septal occluder devices.  Patient now 9 years out from her procedure and doing well clinically.  This year's echo is reviewed and shows stable position of her atrial septal occluder devices with no clear flow across the devices.  Patient remains on aspirin  81 mg daily.
